# Patient Record
Sex: Male | Born: 1965
Health system: Southern US, Community
[De-identification: ages and names within clinical notes are randomized; demographics above are authoritative.]

## PROBLEM LIST (undated history)

## (undated) HISTORY — PX: SIGMOIDOSCOPY: SUR1295

---

## 2000-09-21 HISTORY — PX: VASECTOMY: SHX75

## 2013-07-04 ENCOUNTER — Encounter (INDEPENDENT_AMBULATORY_CARE_PROVIDER_SITE_OTHER): Payer: Self-pay

## 2013-07-04 ENCOUNTER — Encounter (INDEPENDENT_AMBULATORY_CARE_PROVIDER_SITE_OTHER): Payer: Self-pay | Admitting: Surgery

## 2013-07-04 ENCOUNTER — Ambulatory Visit (INDEPENDENT_AMBULATORY_CARE_PROVIDER_SITE_OTHER): Payer: Private Health Insurance - Indemnity | Admitting: Surgery

## 2013-07-04 VITALS — BP 130/76 | HR 70 | Temp 98.0°F | Resp 18 | Ht 67.0 in | Wt 155.0 lb

## 2013-07-04 DIAGNOSIS — K648 Other hemorrhoids: Secondary | ICD-10-CM

## 2013-07-04 DIAGNOSIS — K644 Residual hemorrhoidal skin tags: Secondary | ICD-10-CM

## 2013-07-04 NOTE — Progress Notes (Signed)
Subjective:     Patient ID: Samuel Wolfe, male   DOB: 18-Jun-1966, 47 y.o.   MRN: [id number]  HPI  Samuel Wolfe  1966/05/30 [id number]  Patient Care Team: Charna Archer as Attending Physician (Emergency Medicine)  This patient is a 47 y.o.male who presents today for surgical evaluation at the request of Samuel Wolfe.   Reason for visit: External hemorrhoids.  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  Usually has a bowel movement twice a day.  He has struggled with anal pain and presumed hemorrhoids for over two decades.  Can be painful to have bowel movements a time but not severe sharp pains.  Morbidly underlying.  Uses suppositories.  It often helps.  He has never had any prior interventions.  He has been hesitant to have anything done, but they are not going away.  He was discussed with his primary care physician in [place], [place] and then when he relocated here.  Surgical consultation requested.  Had a colonoscopy in 1998 that he tells me was normal.  This was for him bleeding/hemorrhoids.  I do not have a report of it with him.  There are no active problems to display for this patient.   No past medical history on file.  Past Surgical History  Procedure Laterality Date  . Vasectomy  2002    History   Social History  . Marital Status: Married    Spouse Name: N/A    Number of Children: N/A  . Years of Education: N/A   Occupational History  . Not on file.   Social History Main Topics  . Smoking status: Never Smoker   . Smokeless tobacco: Not on file  . Alcohol Use: Not on file  . Drug Use: Not on file  . Sexual Activity: Not on file   Other Topics Concern  . Not on file   Social History Narrative  . No narrative on file    Family History  Problem Relation Age of Onset  . Diabetes Father   . Heart disease Father   . Kidney disease Father     Current Outpatient Prescriptions  Medication Sig Dispense  Refill  . hydrocortisone-pramoxine (ANALPRAM-HC) 2.5-1 % rectal cream        No current facility-administered medications for this visit.     No Known Allergies  BP 130/76  Pulse 70  Temp(Src) 98 F (36.7 C)  Resp 18  Ht 5\' 7"  (1.702 m)  Wt 155 lb (70.308 kg)  BMI 24.27 kg/m2  No results found.   Review of Systems  Constitutional: Negative for fever, chills and diaphoresis.  HENT: Negative for sore throat and trouble swallowing.   Eyes: Negative for photophobia and visual disturbance.  Respiratory: Negative for choking and shortness of breath.   Cardiovascular: Negative for chest pain, palpitations and leg swelling.  Gastrointestinal: Positive for anal bleeding and rectal pain. Negative for nausea, vomiting, abdominal pain, diarrhea, constipation, blood in stool and abdominal distention.  Genitourinary: Negative for dysuria, urgency, difficulty urinating and testicular pain.  Musculoskeletal: Negative for arthralgias, gait problem, myalgias and neck pain.  Skin: Negative for color change and rash.  Neurological: Negative for dizziness, speech difficulty, weakness and numbness.  Hematological: Negative for adenopathy.  Psychiatric/Behavioral: Negative for hallucinations, confusion and agitation.       Objective:   Physical Exam  Constitutional: He is oriented to person, place, and time. He appears well-developed and well-nourished. No distress.  HENT:  Head: Normocephalic.  Mouth/Throat: Oropharynx is clear and moist. No oropharyngeal exudate.  Eyes: Conjunctivae and EOM are normal. Pupils are equal, round, and reactive to light. No scleral icterus.  Neck: Normal range of motion. Neck supple. No tracheal deviation present.  Cardiovascular: Normal rate, regular rhythm, normal heart sounds and intact distal pulses.   Pulmonary/Chest: Effort normal and breath sounds normal. No respiratory distress.  Abdominal: Soft. He exhibits no distension. There is no tenderness. Hernia  confirmed negative in the right inguinal area and confirmed negative in the left inguinal area.  Incisions clean with normal healing ridges.  No hernias  Genitourinary:  Exam done with assistance of male Medical Assistant in the room.  Perianal skin clean with good hygiene.  No pruritis.  No pilonidal disease.  No fissure.  No abscess/fistula.    R ant midline ext hemorrhoid bulging.  Tolerates digital and anoscopic rectal exam.  Increased sphincter tone.  No rectal masses.  Hemorrhoidal piles internally enlarged & friable x 3   Musculoskeletal: Normal range of motion. He exhibits no tenderness.  Lymphadenopathy:    He has no cervical adenopathy.       Right: No inguinal adenopathy present.       Left: No inguinal adenopathy present.  Neurological: He is alert and oriented to person, place, and time. No cranial nerve deficit. He exhibits normal muscle tone. Coordination normal.  Skin: Skin is warm and dry. No rash noted. He is not diaphoretic. No erythema. No pallor.  Psychiatric: He has a normal mood and affect. His behavior is normal. Judgment and thought content normal.       Assessment:     Symptomatic internal hemorrhoids with one external hemorrhoid.     Plan:     I long discussion with the patient.  I offered banding as a reasonable therapy to help them shrink down and hopefully become less symptomatic.  He agreed:  The anatomy & physiology of the anorectal region was discussed.  The pathophysiology of hemorrhoids and differential diagnosis was discussed.  Natural history progression  was discussed.   I stressed the importance of a fiber bowel regimen to have daily soft bowel movements to minimize progression of disease.     The patient's symptoms are not adequately controlled.  Therefore, I recommended banding to treat the hemorrhoids.  I went over the technique, risks, benefits, and alternatives.   Goals of post-operative recovery were discussed as well.  Questions were  answered.  The patient expressed understanding & wished to proceed.  The patient was positioned in the lateral decubitus position.  Perianal & rectal examination was done.  Using anoscopy, I ligated the hemorrhoids above the dentate line with banding.  The right anterior internal hemorrhoidal pile was somewhat thickened and hard to place a band fully on.  I was able to put a band on the other 2 piles.  The patient tolerated the procedure well.  Educational handouts further explaining the pathology, treatment options, and bowel regimen were given as well. He may require repeated banding, but I do not think it will progress to the point of requiring THD surgical ligation or hemorrhoidectomy at this time.  He and his wife feel reassured.

## 2013-07-04 NOTE — Patient Instructions (Addendum)
ANORECTAL SURGERY: POST OP INSTRUCTIONS  1. Take your usually prescribed home medications unless otherwise directed. 2. DIET: Follow a light bland diet the first 24 hours after arrival home, such as soup, liquids, crackers, etc.  Be sure to include lots of fluids daily.  Avoid fast food or heavy meals as your are more likely to get nauseated.  Eat a low fat the next few days after surgery.   3. PAIN CONTROL: a. Pain is best controlled by a usual combination of three different methods TOGETHER: i. Ice/Heat ii. Over the counter pain medication iii. Prescription pain medication b. Most patients will experience some swelling and discomfort in the anus/rectal area. and incisions.  Ice packs or heat (30-60 minutes up to 6 times a day) will help. Use ice for the first few days to help decrease swelling and bruising, then switch to heat such as warm towels, sitz baths, warm baths, etc to help relax tight/sore spots and speed recovery.  Some people prefer to use ice alone, heat alone, alternating between ice & heat.  Experiment to what works for you.  Swelling and bruising can take several weeks to resolve.   c. It is helpful to take an over-the-counter pain medication regularly for the first few weeks.  Choose one of the following that works best for you: i. Naproxen (Aleve, etc)  Two 220mg tabs twice a day ii. Ibuprofen (Advil, etc) Three 200mg tabs four times a day (every meal & bedtime) iii. Acetaminophen (Tylenol, etc) 500-650mg four times a day (every meal & bedtime) d. A  prescription for pain medication (such as oxycodone, hydrocodone, etc) should be given to you upon discharge.  Take your pain medication as prescribed.  i. If you are having problems/concerns with the prescription medicine (does not control pain, nausea, vomiting, rash, itching, etc), please call us [phone] to see if we need to switch you to a different pain medicine that will work better for you and/or control your side effect  better. ii. If you need a refill on your pain medication, please contact your pharmacy.  They will contact our office to request authorization. Prescriptions will not be filled after 5 pm or on week-ends.  Use a Sitz Bath 4-8 times a day for relief A sitz bath is a warm water bath taken in the sitting position that covers only the hips and buttocks. It may be used for either healing or hygiene purposes. Sitz baths are also used to relieve pain, itching, or muscle spasms. The water may contain medicine. Moist heat will help you heal and relax.  HOME CARE INSTRUCTIONS  Take 3 to 4 sitz baths a day. 1. Fill the bathtub half full with warm water. 2. Sit in the water and open the drain a little. 3. Turn on the warm water to keep the tub half full. Keep the water running constantly. 4. Soak in the water for 15 to 20 minutes. 5. After the sitz bath, pat the affected area dry first. SEEK MEDICAL CARE IF:  You get worse instead of better. Stop the sitz baths if you get worse.   4. KEEP YOUR BOWELS REGULAR a. The goal is one bowel movement a day b. Avoid getting constipated.  Between the surgery and the pain medications, it is common to experience some constipation.  Increasing fluid intake and taking a fiber supplement (such as Metamucil, Citrucel, FiberCon, MiraLax, etc) 1-2 times a day regularly will usually help prevent this problem from occurring.  A mild   laxative (prune juice, Milk of Magnesia, MiraLax, etc) should be taken according to package directions if there are no bowel movements after 48 hours. c. Watch out for diarrhea.  If you have many loose bowel movements, simplify your diet to bland foods & liquids for a few days.  Stop any stool softeners and decrease your fiber supplement.  Switching to mild anti-diarrheal medications (Kayopectate, Pepto Bismol) can help.  If this worsens or does not improve, please call us.  5. Wound Care a. Remove your bandages the day after surgery.  Unless  discharge instructions indicate otherwise, leave your bandage dry and in place overnight.  Remove the bandage during your first bowel movement.   b. Allow the wound packing to fall out over the next few days.  You can trim exposed gauze / ribbon as it falls out.  You do not need to repack the wound unless instructed otherwise.  Wear an absorbent pad or soft cotton gauze in your underwear as needed to catch any drainage and help keep the area  c. Keep the area clean and dry.  Bathe / shower every day.  Keep the area clean by showering / bathing over the incision / wound.   It is okay to soak an open wound to help wash it.  Wet wipes or showers / gentle washing after bowel movements is often less traumatic than regular toilet paper. d. You may have some styrofoam-like soft packing in the rectum which will come out with the first bowel movement.  e. You will often notice bleeding with bowel movements.  This should slow down by the end of the first week of surgery f. Expect some drainage.  This should slow down, too, by the end of the first week of surgery.  Wear an absorbent pad or soft cotton gauze in your underwear until the drainage stops. 6. ACTIVITIES as tolerated:   a. You may resume regular (light) daily activities beginning the next day-such as daily self-care, walking, climbing stairs-gradually increasing activities as tolerated.  If you can walk 30 minutes without difficulty, it is safe to try more intense activity such as jogging, treadmill, bicycling, low-impact aerobics, swimming, etc. b. Save the most intensive and strenuous activity for last such as sit-ups, heavy lifting, contact sports, etc  Refrain from any heavy lifting or straining until you are off narcotics for pain control.   c. DO NOT PUSH THROUGH PAIN.  Let pain be your guide: If it hurts to do something, don't do it.  Pain is your body warning you to avoid that activity for another week until the pain goes down. d. You may drive when  you are no longer taking prescription pain medication, you can comfortably sit for long periods of time, and you can safely maneuver your car and apply brakes. e. You may have sexual intercourse when it is comfortable.  7. FOLLOW UP in our office a. Please call [facility] at [phone] to set up an appointment to see your surgeon in the office for a follow-up appointment approximately 2 weeks after your surgery. b. Make sure that you call for this appointment the day you arrive home to insure a convenient appointment time. 10. IF YOU HAVE DISABILITY OR FAMILY LEAVE FORMS, BRING THEM TO THE OFFICE FOR PROCESSING.  DO NOT GIVE THEM TO YOUR DOCTOR.        WHEN TO CALL US [phone]: 1. Poor pain control 2. Reactions / problems with new medications (rash/itching, nausea, etc)    3. Fever over 101.5 F (38.5 C) 4. Inability to urinate 5. Nausea and/or vomiting 6. Worsening swelling or bruising 7. Continued bleeding from incision. 8. Increased pain, redness, or drainage from the incision  The clinic staff is available to answer your questions during regular business hours (8:30am-5pm).  Please don't hesitate to call and ask to speak to one of our nurses for clinical concerns.   A surgeon from [facility] is always on call at the hospitals   If you have a medical emergency, go to the nearest emergency room or call 911.    [facility] [address], [address], [place], [place]  [address] ? MAIN: [phone] ? TOLL FREE: [phone] ? FAX [phone] [email or web address]  HEMORRHOIDS  The rectum is the last foot of your colon, and it naturally stretches to hold stool.  Hemorrhoidal piles are natural clusters of blood vessels that help the rectum and anal canal stretch to hold stool and allow bowel movements to eliminate feces.   Hemorrhoids are abnormally swollen blood vessels in the rectum.  Too much pressure in the rectum causes  hemorrhoids by forcing blood to stretch and bulge the walls of the veins, sometimes even rupturing them.  Hemorrhoids can become like varicose veins you might see on a person's legs.  Most people will develop a flare of hemorrhoids in their lifetime.  When bulging hemorrhoidal veins are irritated, they can swell, burn, itch, cause pain, and bleed.  Most flares will calm down gradually own within a few weeks.  However, once hemorrhoids are created, they are difficult to get rid of completely and tend to flare more easily than the first flare.   Fortunately, good habits and simple medical treatment usually control hemorrhoids well, and surgery is needed only in severe cases. Types of Hemorrhoids:  Internal hemorrhoids usually don't initially hurt or itch; they are deep inside the rectum and usually have no sensation. If they begin to push out (prolapse), pain and burning can occur.  However, internal hemorrhoids can bleed.  Anal bleeding should not be ignored since bleeding could come from a dangerous source like colorectal cancer, so persistent rectal bleeding should be investigated by a doctor, sometimes with a colonoscopy.  External hemorrhoids cause most of the symptoms - pain, burning, and itching. Nonirritated hemorrhoids can look like small skin tags coming out of the anus.   Thrombosed hemorrhoids can form when a hemorrhoid blood vessel bursts and causes the hemorrhoid to suddenly swell.  A purple blood clot can form in it and become an excruciatingly painful lump at the anus. Because of these unpleasant symptoms, immediate incision and drainage by a surgeon at an office visit can provide much relief of the pain.    PREVENTION Avoiding the most frequent causes listed below will prevent most cases of hemorrhoids: Constipation Hard stools Diarrhea  Constant sitting  Straining with bowel movements Sitting on the toilet for a long time  Severe coughing  episodes Pregnancy / Childbirth  Heavy  Lifting  Sometimes avoiding the above triggers is difficult:  How can you avoid sitting all day if you have a seated job? Also, we try to avoid coughing and diarrhea, but sometimes it's beyond your control.  Still, there are some practical hints to help: Keep the anal and genital area clean.  Moistened tissues such as flushable wet wipes are less irritating than toilet paper.  Using irrigating showers or bottle irrigation washing gently cleans this sensitive area.     Avoid dry toilet paper when cleaning after bowel movements.  . Keep the anal and genital area dry.  Lightly pat the rectal area dry.  Avoid rubbing.  Talcum or baby powders can help GET YOUR STOOLS SOFT.   This is the most important way to prevent irritated hemorrhoids.  Hard stools are like sandpaper to the anorectal canal and will cause more problems.  The goal: ONE SOFT BOWEL MOVEMENT A DAY!  BMs from every other day to 3 times a day is a tolerable range Treat coughing, diarrhea and constipation early since irritated hemorrhoids may soon follow.  If your main job activity is seated, always stand or walk during your breaks. Make it a point to stand and walk at least 5 minutes every hour and try to shift frequently in your chair to avoid direct rectal pressure.  Always exhale as you strain or lift. Don't hold your breath.  Do not delay or try to prevent a bowel movement when the urge is present. Exercise regularly (walking or jogging 60 minutes a day) to stimulate the bowels to move. No reading or other activity while on the toilet. If bowel movements take longer than 5 minutes, you are too constipated. AVOID CONSTIPATION Drink plenty of liquids (1 1/2 to 2 quarts of water and other fluids a day unless fluid restricted for another medical condition). Liquids that contain caffeine (coffee a, tea, soft drinks) can be dehydrating and should be avoided until constipation is controlled. Consider minimizing milk, as dairy products may be  constipating. Eat plenty of fiber (30g a day ideal, more if needed).  Fiber is the undigested part of plant food that passes into the colon, acting as "natures broom" to encourage bowel motility and movement.  Fiber can absorb and hold large amounts of water. This results in a larger, bulkier stool, which is soft and easier to pass.  Eating foods high in fiber - 12 servings - such as  Vegetables: Root (potatoes, carrots, turnips), Leafy green (lettuce, salad greens, celery, spinach), High residue (cabbage, broccoli, etc.) Fruit: Fresh, Dried (prunes, apricots, cherries), Stewed (applesauce)  Whole grain breads, pasta, whole wheat Bran cereals, muffins, etc. Consider adding supplemental bulking fiber which retains large volumes of water: Psyllium ground seeds (native plant from [place])--available as Metamucil, Konsyl, Effersyllium, Per Diem Fiber, or the less expensive generic forms.  Citrucel  (methylcellulose wood fiber) . FiberCon (Polycarbophil) Polyethylene Glycol - and "artificial" fiber commonly called Miralax or Glycolax.  It is helpful for people with gassy or bloated feelings with regular fiber Flax Seed - a less gassy natural fiber  Laxatives can be useful for a short period if constipation is severe Osmotics (Milk of Magnesia, Fleets Phospho-Soda, Magnesium Citrate)  Stimulants (Senokot,   Castor Oil,  Dulcolax, Ex-Lax)    Laxatives are not a good long-term solution as it can stress the bowels and cause too much mineral loss and dehydration.   Avoid taking laxatives for more than 7 days in a row.  AVOID DIARRHEA Switch to liquids and simpler foods for a few days to avoid stressing your intestines further. Avoid dairy products (especially milk & ice cream) for a short time.  The intestines often can lose the ability to digest lactose when stressed. Avoid foods that cause gassiness or bloating.  Typical foods include beans and other legumes, cabbage, broccoli, and dairy foods.   Every person has some sensitivity to other foods, so listen to your body and avoid those foods that trigger problems for   you. Adding fiber (Citrucel, Metamucil, FiberCon, Flax seed, Miralax) gradually can help thicken stools by absorbing excess fluid and retrain the intestines to act more normally.  Slowly increase the dose over a few weeks.  Too much fiber too soon can backfire and cause cramping & bloating. Probiotics (such as active yogurt, Align, etc) may help repopulate the intestines and colon with normal bacteria and calm down a sensitive digestive tract.  Most studies show it to be of mild help, though, and such products can be costly. Medicines: Bismuth subsalicylate (ex. Kayopectate, Pepto Bismol) every 30 minutes for up to 6 doses can help control diarrhea.  Avoid if pregnant. Loperamide (Immodium) can slow down diarrhea.  Start with two tablets (4mg total) first and then try one tablet every 6 hours.  Avoid if you are having fevers or severe pain.  If you are not better or start feeling worse, stop all medicines and call your doctor for advice Call your doctor if you are getting worse or not better.  Sometimes further testing (cultures, endoscopy, X-ray studies, bloodwork, etc) may be needed to help diagnose and treat the cause of the diarrhea. TREATMENT OF HEMORRHOID FLARE If these preventive measures fail, you must take action right away! Hemorrhoids are one condition that can be mild in the morning and become intolerable by nightfall. Most hemorrhoidal flares take several weeks to calm down.  These suggestions can help: Warm soaks.  This helps more than any topical medication.  Use up to 8 times a day.  Usually sitz baths or sitting in a warm bathtub helps.  Sitting on moist warm towels are helpful.  Switching to ice packs/cool compresses can be helpful  Use a Sitz Bath 4-8 times a day for relief A sitz bath is a warm water bath taken in the sitting position that covers only the hips and  buttocks. It may be used for either healing or hygiene purposes. Sitz baths are also used to relieve pain, itching, or muscle spasms. The water may contain medicine. Moist heat will help you heal and relax.  HOME CARE INSTRUCTIONS  Take 3 to 4 sitz baths a day. 6. Fill the bathtub half full with warm water. 7. Sit in the water and open the drain a little. 8. Turn on the warm water to keep the tub half full. Keep the water running constantly. 9. Soak in the water for 15 to 20 minutes. 10. After the sitz bath, pat the affected area dry first. SEEK MEDICAL CARE IF:  You get worse instead of better. Stop the sitz baths if you get worse.  Normalize your bowels.  Extremes of diarrhea or constipation will make hemorrhoids worse.  One soft bowel movement a day is the goal.  Fiber can help get your bowels regular Wet wipes instead of toilet paper Pain control with a NSAID such as ibuprofen (Advil) or naproxen (Aleve) or acetaminophen (Tylenol) around the clock.  Narcotics are constipating and should be minimized if possible Topical creams contain steroids (bydrocortisone) or local anesthetic (xylocaine) can help make pain and itching more tolerable.   EVALUATION If hemorrhoids are still causing problems, you could benefit by an evaluation by a surgeon.  The surgeon will obtain a history and examine you.  If hemorrhoids are diagnosed, some therapies can be offered in the office, usually with an anoscope into the less sensitive area of the rectum: -injection of hemorrhoids (sclerotherapy) can scar the blood vessels of the swollen/enlarged hemorrhoids to help shrink them down to   a more normal size -rubber banding of the enlarged hemorrhoids to help shrink them down to a more normal size -drainage of the blood clot causing a thrombosed hemorrhoid,  to relieve the severe pain   While 90% of the time such problems from hemorrhoids can be managed without preceding to surgery, sometimes the hemorrhoids require a  operation to control the problem (uncontrolled bleeding, prolapse, pain, etc.).   This involves being placed under general anesthesia where the surgeon can confirm the diagnosis and remove, suture, or staple the hemorrhoid(s).  Your surgeon can help you treat the problem appropriately.    GETTING TO GOOD BOWEL HEALTH. Irregular bowel habits such as constipation and diarrhea can lead to many problems over time.  Having one soft bowel movement a day is the most important way to prevent further problems.  The anorectal canal is designed to handle stretching and feces to safely manage our ability to get rid of solid waste (feces, poop, stool) out of our body.  BUT, hard constipated stools can act like ripping concrete bricks and diarrhea can be a burning fire to this very sensitive area of our body, causing inflamed hemorrhoids, anal fissures, increasing risk is perirectal abscesses, abdominal pain/bloating, an making irritable bowel worse.     The goal: ONE SOFT BOWEL MOVEMENT A DAY!  To have soft, regular bowel movements:    Drink at least 8 tall glasses of water a day.     Take plenty of fiber.  Fiber is the undigested part of plant food that passes into the colon, acting s "natures broom" to encourage bowel motility and movement.  Fiber can absorb and hold large amounts of water. This results in a larger, bulkier stool, which is soft and easier to pass. Work gradually over several weeks up to 6 servings a day of fiber (25g a day even more if needed) in the form of: o Vegetables -- Root (potatoes, carrots, turnips), leafy green (lettuce, salad greens, celery, spinach), or cooked high residue (cabbage, broccoli, etc) o Fruit -- Fresh (unpeeled skin & pulp), Dried (prunes, apricots, cherries, etc ),  or stewed ( applesauce)  o Whole grain breads, pasta, etc (whole wheat)  o Bran cereals    Bulking Agents -- This type of water-retaining fiber generally is easily obtained each day by one of the following:   o Psyllium bran -- The psyllium plant is remarkable because its ground seeds can retain so much water. This product is available as Metamucil, Konsyl, Effersyllium, Per Diem Fiber, or the less expensive generic preparation in drug and health food stores. Although labeled a laxative, it really is not a laxative.  o Methylcellulose -- This is another fiber derived from wood which also retains water. It is available as Citrucel. o Polyethylene Glycol - and "artificial" fiber commonly called Miralax or Glycolax.  It is helpful for people with gassy or bloated feelings with regular fiber o Flax Seed - a less gassy fiber than psyllium   No reading or other relaxing activity while on the toilet. If bowel movements take longer than 5 minutes, you are too constipated   AVOID CONSTIPATION.  High fiber and water intake usually takes care of this.  Sometimes a laxative is needed to stimulate more frequent bowel movements, but    Laxatives are not a good long-term solution as it can wear the colon out. o Osmotics (Milk of Magnesia, Fleets phosphosoda, Magnesium citrate, MiraLax, GoLytely) are safer than  o Stimulants (Senokot, Castor Oil, Dulcolax,   Ex Lax)    o Do not take laxatives for more than 7days in a row.    IF SEVERELY CONSTIPATED, try a Bowel Retraining Program: o Do not use laxatives.  o Eat a diet high in roughage, such as bran cereals and leafy vegetables.  o Drink six (6) ounces of prune or apricot juice each morning.  o Eat two (2) large servings of stewed fruit each day.  o Take one (1) heaping tablespoon of a psyllium-based bulking agent twice a day. Use sugar-free sweetener when possible to avoid excessive calories.  o Eat a normal breakfast.  o Set aside 15 minutes after breakfast to sit on the toilet, but do not strain to have a bowel movement.  o If you do not have a bowel movement by the third day, use an enema and repeat the above steps.    Controlling diarrhea o Switch to liquids and  simpler foods for a few days to avoid stressing your intestines further. o Avoid dairy products (especially milk & ice cream) for a short time.  The intestines often can lose the ability to digest lactose when stressed. o Avoid foods that cause gassiness or bloating.  Typical foods include beans and other legumes, cabbage, broccoli, and dairy foods.  Every person has some sensitivity to other foods, so listen to our body and avoid those foods that trigger problems for you. o Adding fiber (Citrucel, Metamucil, psyllium, Miralax) gradually can help thicken stools by absorbing excess fluid and retrain the intestines to act more normally.  Slowly increase the dose over a few weeks.  Too much fiber too soon can backfire and cause cramping & bloating. o Probiotics (such as active yogurt, Align, etc) may help repopulate the intestines and colon with normal bacteria and calm down a sensitive digestive tract.  Most studies show it to be of mild help, though, and such products can be costly. o Medicines:   Bismuth subsalicylate (ex. Kayopectate, Pepto Bismol) every 30 minutes for up to 6 doses can help control diarrhea.  Avoid if pregnant.   Loperamide (Immodium) can slow down diarrhea.  Start with two tablets (4mg total) first and then try one tablet every 6 hours.  Avoid if you are having fevers or severe pain.  If you are not better or start feeling worse, stop all medicines and call your doctor for advice o Call your doctor if you are getting worse or not better.  Sometimes further testing (cultures, endoscopy, X-ray studies, bloodwork, etc) may be needed to help diagnose and treat the cause of the diarrhea. o  

## 2018-07-11 ENCOUNTER — Ambulatory Visit: Payer: Commercial Managed Care - PPO | Admitting: Family Medicine

## 2018-07-11 ENCOUNTER — Other Ambulatory Visit: Payer: Self-pay

## 2018-07-11 ENCOUNTER — Encounter: Payer: Self-pay | Admitting: Family Medicine

## 2018-07-11 VITALS — BP 139/82 | HR 64 | Temp 98.6°F | Ht 68.0 in | Wt 159.6 lb

## 2018-07-11 DIAGNOSIS — M25551 Pain in right hip: Secondary | ICD-10-CM

## 2018-07-11 DIAGNOSIS — Z23 Encounter for immunization: Secondary | ICD-10-CM | POA: Diagnosis not present

## 2018-07-11 NOTE — Patient Instructions (Signed)
° ° ° °  If you have lab work done today you will be contacted with your lab results within the next 2 weeks.  If you have not heard from us then please contact us. The fastest way to get your results is to register for My Chart. ° ° °IF you received an x-ray today, you will receive an invoice from [facility]. Please contact [facility] at [phone] with questions or concerns regarding your invoice.  ° °IF you received labwork today, you will receive an invoice from [facility]. Please contact [facility] at [phone] with questions or concerns regarding your invoice.  ° °Our billing staff will not be able to assist you with questions regarding bills from these companies. ° °You will be contacted with the lab results as soon as they are available. The fastest way to get your results is to activate your My Chart account. Instructions are located on the last page of this paperwork. If you have not heard from us regarding the results in 2 weeks, please contact this office. °  ° ° ° °

## 2018-07-11 NOTE — Progress Notes (Signed)
   10/21/20194:22 PM  Chevelle Durr [birth year], 52 y.o. male [id number]  Chief Complaint  Patient presents with  . Pain    sharp pain in the right hip, had imaging done, no abnormal findings. Pain is now dull. Taking advil for pain. Started while running on uneven surface    HPI:   Patient is a 52 y.o. male who presents today for right hip pain  About a week ago started having right hip/low back pain after he went to pickup a folding ladder Then started noticing pain along ASIS Went to [facility] - xray was normal He did some gentle yoga stretches and ibu Pain now overall much better  When he sits for long periods of time He is wondering if a standing desk would help  He runs 4-6 miles 3 times a week  Also requesting Td booster, last one over 10 years ago Will be working a new old home he recently bought   Fall Risk  07/11/2018 07/11/2018  Falls in the past year? No No     Depression screen [facility] 2/9 07/11/2018 07/11/2018  Decreased Interest 0 0  Down, Depressed, Hopeless 0 0  PHQ - 2 Score 0 0    No Known Allergies  Prior to Admission medications   Medication Sig Start Date End Date Taking? Authorizing Provider  hydrocortisone-pramoxine [facility]) 2.5-1 % rectal cream  06/30/13   [provider]    History reviewed. No pertinent past medical history.  Past Surgical History:  Procedure Laterality Date  . VASECTOMY  2002    Social History   Tobacco Use  . Smoking status: Never Smoker  . Smokeless tobacco: Never Used  Substance Use Topics  . Alcohol use: Never    Frequency: Never    Family History  Problem Relation Age of Onset  . Diabetes Father   . Heart disease Father   . Kidney disease Father     ROS Per hpi  OBJECTIVE:  Blood pressure 139/82, pulse 64, temperature 98.6 F (37 C), temperature source Oral, height 5\' 8"  (1.727 m), weight 159 lb 9.6 oz (72.4 kg), SpO2 98 %. Body mass index is 24.27 kg/m.   Physical Exam   Constitutional: He is oriented to person, place, and time. He appears well-developed and well-nourished.  HENT:  Head: Normocephalic and atraumatic.  Mouth/Throat: Oropharynx is clear and moist.  Eyes: Pupils are equal, round, and reactive to light. Conjunctivae and EOM are normal.  Neck: Neck supple.  Pulmonary/Chest: Effort normal.  Musculoskeletal:       Right hip: Normal.       Left hip: Normal.  Neurological: He is alert and oriented to person, place, and time.  Skin: Skin is warm and dry.  Psychiatric: He has a normal mood and affect.  Nursing note and vitals reviewed.    ASSESSMENT and PLAN  1. Right hip pain Normal exam today. Continue with stretching and strengthening. Provided patient handouts. Resume running slowly, advance as tolerated. Letter for standing/sitting workstation provided to help with hip flexors.   Other orders - Td vaccine greater than or equal to 7yo preservative free IM  Return if symptoms worsen or fail to improve.    Rutherford Guys, MD Primary Care at [place], [place] [address] Ph.  [phone] Fax [phone]

## 2018-10-25 DIAGNOSIS — M65312 Trigger thumb, left thumb: Secondary | ICD-10-CM | POA: Diagnosis not present

## 2018-11-07 DIAGNOSIS — M65311 Trigger thumb, right thumb: Secondary | ICD-10-CM | POA: Diagnosis not present

## 2018-12-02 DIAGNOSIS — R3912 Poor urinary stream: Secondary | ICD-10-CM | POA: Diagnosis not present

## 2019-06-13 ENCOUNTER — Telehealth: Payer: Self-pay | Admitting: Family Medicine

## 2019-06-13 NOTE — Telephone Encounter (Signed)
Copied from [place] [id number]. Topic: Quick Communication - Appointment Cancellation >> Jun 13, 2019 12:16 PM Celene Kras A wrote: Patient called to cancel appointment scheduled for 06/15/2019. Patient has not rescheduled their appointment. Pt is requesting a call back to reschedule. Please advise.   Route to department's PEC pool. >> Jun 13, 2019  3:57 PM Leta Baptist wrote: Canceled appt and lvmtcb and reschedule appt

## 2019-06-15 ENCOUNTER — Ambulatory Visit: Payer: Commercial Managed Care - PPO | Admitting: Family Medicine

## 2019-07-04 ENCOUNTER — Ambulatory Visit: Payer: Commercial Managed Care - PPO | Admitting: Family Medicine

## 2019-07-04 ENCOUNTER — Encounter: Payer: Self-pay | Admitting: Family Medicine

## 2019-07-04 ENCOUNTER — Other Ambulatory Visit: Payer: Self-pay

## 2019-07-04 VITALS — BP 139/85 | HR 70 | Temp 98.7°F | Ht 68.0 in | Wt 156.4 lb

## 2019-07-04 DIAGNOSIS — Z23 Encounter for immunization: Secondary | ICD-10-CM | POA: Diagnosis not present

## 2019-07-04 DIAGNOSIS — N401 Enlarged prostate with lower urinary tract symptoms: Secondary | ICD-10-CM

## 2019-07-04 DIAGNOSIS — R030 Elevated blood-pressure reading, without diagnosis of hypertension: Secondary | ICD-10-CM

## 2019-07-04 DIAGNOSIS — R972 Elevated prostate specific antigen [PSA]: Secondary | ICD-10-CM

## 2019-07-04 DIAGNOSIS — R3912 Poor urinary stream: Secondary | ICD-10-CM

## 2019-07-04 DIAGNOSIS — Z1211 Encounter for screening for malignant neoplasm of colon: Secondary | ICD-10-CM

## 2019-07-04 DIAGNOSIS — Z125 Encounter for screening for malignant neoplasm of prostate: Secondary | ICD-10-CM

## 2019-07-04 LAB — POCT URINALYSIS DIP (MANUAL ENTRY)
Bilirubin, UA: NEGATIVE
Blood, UA: NEGATIVE
Glucose, UA: NEGATIVE mg/dL
Ketones, POC UA: NEGATIVE mg/dL
Leukocytes, UA: NEGATIVE
Nitrite, UA: NEGATIVE
Protein Ur, POC: NEGATIVE mg/dL
Spec Grav, UA: 1.015 (ref 1.010–1.025)
Urobilinogen, UA: 0.2 E.U./dL
pH, UA: 7 (ref 5.0–8.0)

## 2019-07-04 MED ORDER — TAMSULOSIN HCL 0.4 MG PO CAPS: 0.4000 mg | ORAL_CAPSULE | Freq: Every day | ORAL | 3 refills | Status: DC

## 2019-07-04 NOTE — Patient Instructions (Signed)
° ° ° °  If you have lab work done today you will be contacted with your lab results within the next 2 weeks.  If you have not heard from us then please contact us. The fastest way to get your results is to register for My Chart. ° ° °IF you received an x-ray today, you will receive an invoice from [facility]. Please contact [facility] at [phone] with questions or concerns regarding your invoice.  ° °IF you received labwork today, you will receive an invoice from [facility]. Please contact [facility] at [phone] with questions or concerns regarding your invoice.  ° °Our billing staff will not be able to assist you with questions regarding bills from these companies. ° °You will be contacted with the lab results as soon as they are available. The fastest way to get your results is to activate your My Chart account. Instructions are located on the last page of this paperwork. If you have not heard from us regarding the results in 2 weeks, please contact this office. °  ° ° ° °

## 2019-07-04 NOTE — Progress Notes (Signed)
10/13/202010:44 AM  Samuel Wolfe Aug 10, 1966, 53 y.o., male [id number]  Chief Complaint  Patient presents with  . Urinary Retention    finding that stream in becoming weaker, noticed in April. has had hx of hemmorrhoids has not been a px in a few yrs. Takes laxatives    HPI:   Patient is a 53 y.o. male  who presents today for weak stream  Gradually worsening of urination Having weak stream, taking longer to urinate, able to empty bladder No nocturia No hematuria No dysuria Mild intermittent urgency No fever or chills, weight loss or swollen glands Maybe has a couple of uncles with prostate cancer  Has never had colon cancer screening No fhx colon cancer Has known hemorrhoids  Runs/bikes/walks every day for about 45 minutes Loves cheeze its   Depression screen [facility] 2/9 07/04/2019 07/11/2018 07/11/2018  Decreased Interest 0 0 0  Down, Depressed, Hopeless 0 0 0  PHQ - 2 Score 0 0 0    Fall Risk  07/04/2019 07/11/2018 07/11/2018  Falls in the past year? 0 No No  Number falls in past yr: 0 - -  Injury with Fall? 0 - -     No Known Allergies  Prior to Admission medications   Not on File    History reviewed. No pertinent past medical history.  Past Surgical History:  Procedure Laterality Date  . VASECTOMY  2002    Social History   Tobacco Use  . Smoking status: Never Smoker  . Smokeless tobacco: Never Used  Substance Use Topics  . Alcohol use: Yes    Frequency: Never    Family History  Problem Relation Age of Onset  . Diabetes Father   . Heart disease Father   . Kidney disease Father     Review of Systems  Constitutional: Negative for chills and fever.  Respiratory: Negative for cough and shortness of breath.   Cardiovascular: Negative for chest pain, palpitations and leg swelling.  Gastrointestinal: Negative for abdominal pain, nausea and vomiting.  per hpi   OBJECTIVE:  Today's Vitals   07/04/19 1028  BP: 139/85  Pulse: 70  Temp:  98.7 F (37.1 C)  SpO2: 100%  Weight: 156 lb 6.4 oz (70.9 kg)  Height: 5\' 8"  (1.727 m)   Body mass index is 23.78 kg/m.   Physical Exam Vitals signs and nursing note reviewed.  Constitutional:      Appearance: He is well-developed.  HENT:     Head: Normocephalic and atraumatic.  Eyes:     Conjunctiva/sclera: Conjunctivae normal.     Pupils: Pupils are equal, round, and reactive to light.  Neck:     Musculoskeletal: Neck supple.  Cardiovascular:     Rate and Rhythm: Normal rate and regular rhythm.     Heart sounds: No murmur. No friction rub. No gallop.   Pulmonary:     Effort: Pulmonary effort is normal.     Breath sounds: Normal breath sounds. No wheezing or rales.  Musculoskeletal:     Right lower leg: No edema.     Left lower leg: No edema.  Skin:    General: Skin is warm and dry.  Neurological:     Mental Status: He is alert and oriented to person, place, and time.     Results for orders placed or performed in visit on 07/04/19 (from the past 24 hour(s))  POCT urinalysis dipstick     Status: None   Collection Time: 07/04/19 10:24 AM  Result Value Ref  Range   Color, UA yellow yellow   Clarity, UA clear clear   Glucose, UA negative negative mg/dL   Bilirubin, UA negative negative   Ketones, POC UA negative negative mg/dL   Spec Grav, UA 1.015 1.010 - 1.025   Blood, UA negative negative   pH, UA 7.0 5.0 - 8.0   Protein Ur, POC negative negative mg/dL   Urobilinogen, UA 0.2 0.2 or 1.0 E.U./dL   Nitrite, UA Negative Negative   Leukocytes, UA Negative Negative    No results found.   ASSESSMENT and PLAN  1. Benign prostatic hyperplasia with weak urinary stream Starting flomax, reviewed r/se/b, psa pending. Consider urology referral  2. Weak urinary stream - POCT urinalysis dipstick - CBC - Comprehensive metabolic panel  3. Elevated BP without diagnosis of hypertension Discussed LFM, recheck at next OV  4. Need for prophylactic vaccination and  inoculation against influenza - Flu Vaccine QUAD 36+ mos IM  5. Special screening for malignant neoplasms, colon - Ambulatory referral to Gastroenterology  6. Screening for prostate cancer - PSA  Other orders - tamsulosin (FLOMAX) 0.4 MG CAPS capsule; Take 1 capsule (0.4 mg total) by mouth daily.  Return in about 2 months (around 09/03/2019).    Rutherford Guys, MD Primary Care at [place] [address],  [address] Ph.  [phone] Fax [phone]

## 2019-07-05 LAB — CBC
Hematocrit: 47.2 % (ref 37.5–51.0)
Hemoglobin: 15.7 g/dL (ref 13.0–17.7)
MCH: 30.9 pg (ref 26.6–33.0)
MCHC: 33.3 g/dL (ref 31.5–35.7)
MCV: 93 fL (ref 79–97)
Platelets: 241 10*3/uL (ref 150–450)
RBC: 5.08 x10E6/uL (ref 4.14–5.80)
RDW: 12.8 % (ref 11.6–15.4)
WBC: 4.1 10*3/uL (ref 3.4–10.8)

## 2019-07-05 LAB — COMPREHENSIVE METABOLIC PANEL
ALT: 15 IU/L (ref 0–44)
AST: 25 IU/L (ref 0–40)
Albumin/Globulin Ratio: 1.7 (ref 1.2–2.2)
Albumin: 4.7 g/dL (ref 3.8–4.9)
Alkaline Phosphatase: 75 IU/L (ref 39–117)
BUN/Creatinine Ratio: 9 (ref 9–20)
BUN: 8 mg/dL (ref 6–24)
Bilirubin Total: 0.7 mg/dL (ref 0.0–1.2)
CO2: 22 mmol/L (ref 20–29)
Calcium: 9.5 mg/dL (ref 8.7–10.2)
Chloride: 103 mmol/L (ref 96–106)
Creatinine, Ser: 0.91 mg/dL (ref 0.76–1.27)
GFR calc Af Amer: 111 mL/min/{1.73_m2} (ref >59)
GFR calc non Af Amer: 96 mL/min/{1.73_m2} (ref >59)
Globulin, Total: 2.7 g/dL (ref 1.5–4.5)
Glucose: 95 mg/dL (ref 65–99)
Potassium: 4.5 mmol/L (ref 3.5–5.2)
Sodium: 141 mmol/L (ref 134–144)
Total Protein: 7.4 g/dL (ref 6.0–8.5)

## 2019-07-05 LAB — PSA: Prostate Specific Ag, Serum: 6.5 ng/mL — ABNORMAL HIGH (ref 0.0–4.0)

## 2019-07-11 ENCOUNTER — Telehealth: Payer: Self-pay

## 2019-07-11 NOTE — Telephone Encounter (Signed)
Pt would like to know what does mildly elevated psa mean? His psa is 6.5

## 2019-07-11 NOTE — Addendum Note (Signed)
Addended by: Rutherford Guys on: 07/11/2019 11:06 AM   Modules accepted: Orders

## 2019-07-11 NOTE — Telephone Encounter (Signed)
Pt understood, will wait appt for uro

## 2019-07-11 NOTE — Telephone Encounter (Signed)
PSA elevations are not always concerning, but that is why urologist needs to evaluate and decide next steps. thanks

## 2019-07-13 ENCOUNTER — Encounter: Payer: Self-pay | Admitting: Gastroenterology

## 2019-07-28 ENCOUNTER — Other Ambulatory Visit: Payer: Self-pay

## 2019-07-28 ENCOUNTER — Ambulatory Visit (AMBULATORY_SURGERY_CENTER): Payer: Commercial Managed Care - PPO | Admitting: *Deleted

## 2019-07-28 VITALS — Temp 97.8°F | Ht 68.0 in | Wt 157.0 lb

## 2019-07-28 DIAGNOSIS — Z1211 Encounter for screening for malignant neoplasm of colon: Secondary | ICD-10-CM

## 2019-07-28 DIAGNOSIS — Z1159 Encounter for screening for other viral diseases: Secondary | ICD-10-CM

## 2019-07-28 MED ORDER — NA SULFATE-K SULFATE-MG SULF 17.5-3.13-1.6 GM/177ML PO SOLN: ORAL | 0 refills | Status: DC

## 2019-07-28 NOTE — Progress Notes (Signed)
Patient is here in-person for PV. Patient denies any allergies to eggs or soy. Patient denies any problems with anesthesia/sedation. Patient denies any oxygen use at home. Patient denies taking any diet/weight loss medications or blood thinners. Patient is not being treated for MRSA or C-diff. EMMI education assisgned to the patient for the procedure, this was explained and instructions given to patient. COVID-19 screening test is on 11/17 at 310pm, the pt is aware. Pt is aware that care partner will wait in the car during procedure; if they feel like they will be too hot or cold to wait in the car; they may wait in the 4 th floor lobby. Patient is aware to bring only one care partner. We want them to wear a mask (we do not have any that we can provide them), practice social distancing, and we will check their temperatures when they get here.  I did remind the patient that their care partner needs to stay in the parking lot the entire time and have a cell phone available, we will call them when the pt is ready for discharge. Patient will wear mask into building.    Suprep $15 off coupon given to the patient.

## 2019-07-31 ENCOUNTER — Encounter: Payer: Self-pay | Admitting: Gastroenterology

## 2019-08-08 ENCOUNTER — Ambulatory Visit (INDEPENDENT_AMBULATORY_CARE_PROVIDER_SITE_OTHER): Payer: Commercial Managed Care - PPO

## 2019-08-08 ENCOUNTER — Other Ambulatory Visit: Payer: Self-pay | Admitting: Gastroenterology

## 2019-08-08 DIAGNOSIS — Z1159 Encounter for screening for other viral diseases: Secondary | ICD-10-CM

## 2019-08-09 LAB — SARS CORONAVIRUS 2 (TAT 6-24 HRS): SARS Coronavirus 2: NEGATIVE

## 2019-08-11 ENCOUNTER — Ambulatory Visit (AMBULATORY_SURGERY_CENTER): Payer: Commercial Managed Care - PPO | Admitting: Gastroenterology

## 2019-08-11 ENCOUNTER — Encounter: Payer: Self-pay | Admitting: Gastroenterology

## 2019-08-11 ENCOUNTER — Other Ambulatory Visit: Payer: Self-pay

## 2019-08-11 VITALS — BP 112/62 | HR 54 | Temp 98.3°F | Resp 18 | Ht 68.0 in | Wt 157.0 lb

## 2019-08-11 DIAGNOSIS — K635 Polyp of colon: Secondary | ICD-10-CM

## 2019-08-11 DIAGNOSIS — K640 First degree hemorrhoids: Secondary | ICD-10-CM

## 2019-08-11 DIAGNOSIS — Z1211 Encounter for screening for malignant neoplasm of colon: Secondary | ICD-10-CM | POA: Diagnosis not present

## 2019-08-11 DIAGNOSIS — D125 Benign neoplasm of sigmoid colon: Secondary | ICD-10-CM

## 2019-08-11 MED ORDER — SODIUM CHLORIDE 0.9 % IV SOLN: 500.0000 mL | Freq: Once | INTRAVENOUS | Status: DC

## 2019-08-11 NOTE — Progress Notes (Signed)
A and O x3. Report to RN. Tolerated MAC anesthesia well.

## 2019-08-11 NOTE — Progress Notes (Signed)
Pt's states no medical or surgical changes since previsit or office visit.   Sm IV, CW vitals, JB temps.

## 2019-08-11 NOTE — Patient Instructions (Addendum)
Thank you  for allowing [place] to care for you today.  Await pathology results of removed polyp.  Will make recommendation at that time for your next colonoscopy.    Resume previous diet and medications today.  Return to your normal activities tomorrow.  Recommend taking daily fiber supplement.  For example Citrucel, Fibercon, Konsyl, or Metamucil as per instructions on label.  Drink plenty of water with these products.  If you are interested in treatment of hemorrhoids, contact clinic to set up appointment for evaluation and treatment for  in-office band ligation.      YOU HAD AN ENDOSCOPIC PROCEDURE TODAY AT [facility]:   Refer to the procedure report that was given to you for any specific questions about what was found during the examination.  If the procedure report does not answer your questions, please call your gastroenterologist to clarify.  If you requested that your care partner not be given the details of your procedure findings, then the procedure report has been included in a sealed envelope for you to review at your convenience later.  YOU SHOULD EXPECT: Some feelings of bloating in the abdomen. Passage of more gas than usual.  Walking can help get rid of the air that was put into your GI tract during the procedure and reduce the bloating. If you had a lower endoscopy (such as a colonoscopy or flexible sigmoidoscopy) you may notice spotting of blood in your stool or on the toilet paper. If you underwent a bowel prep for your procedure, you may not have a normal bowel movement for a few days.  Please Note:  You might notice some irritation and congestion in your nose or some drainage.  This is from the oxygen used during your procedure.  There is no need for concern and it should clear up in a day or so.  SYMPTOMS TO REPORT IMMEDIATELY:   Following lower endoscopy (colonoscopy or flexible sigmoidoscopy):  Excessive amounts of blood in the stool  Significant  tenderness or worsening of abdominal pains  Swelling of the abdomen that is new, acute  Fever of 100F or higher   For urgent or emergent issues, a gastroenterologist can be reached at any hour by calling [phone].   DIET:  We do recommend a small meal at first, but then you may proceed to your regular diet.  Drink plenty of fluids but you should avoid alcoholic beverages for 24 hours.  ACTIVITY:  You should plan to take it easy for the rest of today and you should NOT DRIVE or use heavy machinery until tomorrow (because of the sedation medicines used during the test).    FOLLOW UP: Our staff will call the number listed on your records 48-72 hours following your procedure to check on you and address any questions or concerns that you may have regarding the information given to you following your procedure. If we do not reach you, we will leave a message.  We will attempt to reach you two times.  During this call, we will ask if you have developed any symptoms of COVID 19. If you develop any symptoms (ie: fever, flu-like symptoms, shortness of breath, cough etc.) before then, please call [phone].  If you test positive for Covid 19 in the 2 weeks post procedure, please call and report this information to [place].    If any biopsies were taken you will be contacted by phone or by letter within the next 1-3 weeks.  Please call us  at [phone] if you have not heard about the biopsies in 3 weeks.    SIGNATURES/CONFIDENTIALITY: You and/or your care partner have signed paperwork which will be entered into your electronic medical record.  These signatures attest to the fact that that the information above on your After Visit Summary has been reviewed and is understood.  Full responsibility of the confidentiality of this discharge information lies with you and/or your care-partner.

## 2019-08-11 NOTE — Op Note (Signed)
[facility] Patient Name: Samuel Wolfe Procedure Date: 08/11/2019 10:12 AM MRN: [id number] Endoscopist: Gerrit Heck , MD Age: 53 Referring MD:  Date of Birth: 21-Oct-1965 Gender: Male Account #: [id number] Procedure:                Colonoscopy Indications:              Screening for colorectal malignant neoplasm, This                            is the patient's first colonoscopy Medicines:                Monitored Anesthesia Care Procedure:                Pre-Anesthesia Assessment:                           - Prior to the procedure, a History and Physical                            was performed, and patient medications and                            allergies were reviewed. The patient's tolerance of                            previous anesthesia was also reviewed. The risks                            and benefits of the procedure and the sedation                            options and risks were discussed with the patient.                            All questions were answered, and informed consent                            was obtained. Prior Anticoagulants: The patient has                            taken no previous anticoagulant or antiplatelet                            agents. ASA Grade Assessment: II - A patient with                            mild systemic disease. After reviewing the risks                            and benefits, the patient was deemed in                            satisfactory condition to undergo the procedure.  After obtaining informed consent, the colonoscope                            was passed under direct vision. Throughout the                            procedure, the patient's blood pressure, pulse, and                            oxygen saturations were monitored continuously. The                            Colonoscope was introduced through the anus and                            advanced to the the cecum,  identified by the                            ileocecal valve. The colonoscopy was performed                            without difficulty. The patient tolerated the                            procedure well. The quality of the bowel                            preparation was excellent. Scope In: 10:31:03 AM Scope Out: 10:53:18 AM Scope Withdrawal Time: 0 hours 18 minutes 17 seconds  Total Procedure Duration: 0 hours 22 minutes 15 seconds  Findings:                 The perianal and digital rectal examinations were                            normal.                           A 12 mm polyp was found in the sigmoid colon. The                            polyp was removed with a cold snare. Resection and                            retrieval were complete. Estimated blood loss was                            minimal.                           Non-bleeding internal hemorrhoids were found during                            anoscopy. The hemorrhoids were medium-sized.  Retroflexion in the rectum was not performed due to                            narrowed rectal vault.                           The exam was otherwise normal throughout the                            remainder of the colon. Complications:            No immediate complications. Estimated Blood Loss:     Estimated blood loss was minimal. Impression:               - One 12 mm polyp in the sigmoid colon, removed                            with a cold snare. Resected and retrieved.                           - Non-bleeding internal hemorrhoids. Recommendation:           - Patient has a contact number available for                            emergencies. The signs and symptoms of potential                            delayed complications were discussed with the                            patient. Return to normal activities tomorrow.                            Written discharge instructions were provided to the                             patient.                           - Resume previous diet.                           - Continue present medications.                           - Await pathology results.                           - Repeat colonoscopy for surveillance based on                            pathology results.                           - Return to GI clinic PRN.                           -  Use fiber, for example Citrucel, Fibercon, Konsyl                            or Metamucil.                           - Internal hemorrhoids were noted on this study and                            may be amenable to hemorrhoid band ligation. If you                            are interested in further treatment of these                            hemorrhoids with band ligation, please contact my                            clinic to set up an appointment for evaluation and                            treatment. Gerrit Heck, MD 08/11/2019 11:04:12 AM

## 2019-08-11 NOTE — Progress Notes (Signed)
Called to room to assist during endoscopic procedure.  Patient ID and intended procedure confirmed with present staff. Received instructions for my participation in the procedure from the performing physician.  

## 2019-08-15 ENCOUNTER — Telehealth: Payer: Self-pay | Admitting: *Deleted

## 2019-08-15 NOTE — Telephone Encounter (Signed)
No answer for second follow up call. Left message for patient to call with questions or concerns. SM

## 2019-08-15 NOTE — Telephone Encounter (Signed)
No answer, no voicemail available

## 2019-08-21 ENCOUNTER — Encounter: Payer: Self-pay | Admitting: Gastroenterology

## 2019-08-31 ENCOUNTER — Telehealth: Payer: Self-pay | Admitting: Family Medicine

## 2019-08-31 NOTE — Telephone Encounter (Signed)
Copied from [place] [phone]. Topic: General - Inquiry >> Aug 30, 2019  2:59 PM Richardo Priest, NT wrote: Reason for CRM: Pt called in stating he would like to speak with PCP in regards to get an MRI done instead of a biopsy from the recommendation from urologist. Please advise as pt would like feedback if this can be done.

## 2019-08-31 NOTE — Telephone Encounter (Signed)
Please schedule patient a tele Med visit to go over question and concerns that would like to talk to the Dr about to see if there is a plan that both could agree on

## 2019-08-31 NOTE — Telephone Encounter (Signed)
Copied from [place] [phone]. Topic: General - Inquiry >> Aug 30, 2019  2:59 PM Richardo Priest, NT wrote: Reason for CRM: Pt called in stating he would like to speak with PCP in regards to get an MRI done instead of a biopsy from the recommendation from urologist. Please advise as pt would like feedback if this can be done.

## 2019-09-01 ENCOUNTER — Other Ambulatory Visit: Payer: Self-pay

## 2019-09-01 ENCOUNTER — Ambulatory Visit (INDEPENDENT_AMBULATORY_CARE_PROVIDER_SITE_OTHER): Payer: Commercial Managed Care - PPO | Admitting: Family Medicine

## 2019-09-01 ENCOUNTER — Encounter: Payer: Self-pay | Admitting: Family Medicine

## 2019-09-01 VITALS — BP 128/70 | HR 75 | Temp 98.0°F | Ht 68.0 in | Wt 158.8 lb

## 2019-09-01 DIAGNOSIS — N401 Enlarged prostate with lower urinary tract symptoms: Secondary | ICD-10-CM | POA: Diagnosis not present

## 2019-09-01 DIAGNOSIS — R3912 Poor urinary stream: Secondary | ICD-10-CM

## 2019-09-01 DIAGNOSIS — R030 Elevated blood-pressure reading, without diagnosis of hypertension: Secondary | ICD-10-CM

## 2019-09-01 DIAGNOSIS — R972 Elevated prostate specific antigen [PSA]: Secondary | ICD-10-CM

## 2019-09-01 NOTE — Patient Instructions (Signed)
° ° ° °  If you have lab work done today you will be contacted with your lab results within the next 2 weeks.  If you have not heard from us then please contact us. The fastest way to get your results is to register for My Chart. ° ° °IF you received an x-ray today, you will receive an invoice from [facility]. Please contact  Radiology at [phone] with questions or concerns regarding your invoice.  ° °IF you received labwork today, you will receive an invoice from [facility]. Please contact [facility] at [phone] with questions or concerns regarding your invoice.  ° °Our billing staff will not be able to assist you with questions regarding bills from these companies. ° °You will be contacted with the lab results as soon as they are available. The fastest way to get your results is to activate your My Chart account. Instructions are located on the last page of this paperwork. If you have not heard from us regarding the results in 2 weeks, please contact this office. °  ° ° ° °

## 2019-09-01 NOTE — Progress Notes (Signed)
12/11/20208:43 AM  Samuel Wolfe 04/04/1966, 53 y.o., male [id number]  Chief Complaint  Patient presents with  . Follow-up    HPI:   Patient is a 53 y.o. male  who presents today for followup on starting flomax for BPH   Last OV Oct 2020 Started on flomax - helping urinary sx PSA 6.5, referred to urology - saw them, repeated PSA was 20, recommended biopsy, scheduled for Jan 2021 Had non cancerous polyps on colonoscopy, repeat in 10 year  Depression screen [facility] 2/9 09/01/2019 07/04/2019 07/11/2018  Decreased Interest 0 0 0  Down, Depressed, Hopeless 0 0 0  PHQ - 2 Score 0 0 0    Fall Risk  09/01/2019 07/04/2019 07/11/2018 07/11/2018  Falls in the past year? 0 0 No No  Number falls in past yr: 0 0 - -  Injury with Fall? 0 0 - -     No Known Allergies  Prior to Admission medications   Medication Sig Start Date End Date Taking? Authorizing Provider  tamsulosin (FLOMAX) 0.4 MG CAPS capsule Take 0.4 mg by mouth daily.   Yes [provider]  levofloxacin (LEVAQUIN) 750 MG tablet Take 750 mg by mouth daily. 08/28/19   [provider]    History reviewed. No pertinent past medical history.  Past Surgical History:  Procedure Laterality Date  . SIGMOIDOSCOPY  15 years ago    hems only  . VASECTOMY  2002    Social History   Tobacco Use  . Smoking status: Never Smoker  . Smokeless tobacco: Never Used  Substance Use Topics  . Alcohol use: Yes    Alcohol/week: 7.0 - 8.0 standard drinks    Types: 6 - 7 Cans of beer, 1 Glasses of wine per week    Family History  Problem Relation Age of Onset  . Diabetes Father   . Heart disease Father   . Kidney disease Father   . Colon cancer Neg Hx   . Colon polyps Neg Hx   . Esophageal cancer Neg Hx   . Rectal cancer Neg Hx   . Stomach cancer Neg Hx     ROS Per hpi  OBJECTIVE:  Today's Vitals   09/01/19 0838 09/01/19 0910  BP: (!) 151/84 128/70  Pulse: 75   Temp: 98 F (36.7 C)   SpO2: 98%     Weight: 158 lb 12.8 oz (72 kg)   Height: 5\' 8"  (1.727 m)    Body mass index is 24.15 kg/m.  BP Readings from Last 3 Encounters:  09/01/19 128/70  08/11/19 112/62  07/04/19 139/85    Physical Exam Vitals and nursing note reviewed.  Constitutional:      Appearance: He is well-developed.  HENT:     Head: Normocephalic and atraumatic.  Eyes:     Conjunctiva/sclera: Conjunctivae normal.     Pupils: Pupils are equal, round, and reactive to light.  Pulmonary:     Effort: Pulmonary effort is normal.  Musculoskeletal:     Cervical back: Neck supple.  Skin:    General: Skin is warm and dry.  Neurological:     Mental Status: He is alert and oriented to person, place, and time.       No results found for this or any previous visit (from the past 24 hour(s)).  No results found.   ASSESSMENT and PLAN  1. Elevated PSA Managed by urology, pending bx  2. Benign prostatic hyperplasia with weak urinary stream Controlled. Continue current regime.  3. Elevated BP without diagnosis of hypertension - repeat BP - normal, white coat hypertension  Other orders - levofloxacin (LEVAQUIN) 750 MG tablet; Take 750 mg by mouth daily.  No follow-ups on file.    Rutherford Guys, MD [facility] at [facility] [place], [place] [address] Ph.  [phone] Fax [phone]

## 2019-09-06 ENCOUNTER — Other Ambulatory Visit: Payer: Self-pay | Admitting: Urology

## 2019-09-06 DIAGNOSIS — R972 Elevated prostate specific antigen [PSA]: Secondary | ICD-10-CM

## 2019-09-11 ENCOUNTER — Other Ambulatory Visit: Payer: Self-pay | Admitting: Urology

## 2019-09-29 ENCOUNTER — Ambulatory Visit
Admission: RE | Admit: 2019-09-29 | Discharge: 2019-09-29 | Disposition: A | Discharge status: Home / Self Care | Payer: Commercial Managed Care - PPO | Source: Ambulatory Visit | Attending: Urology | Admitting: Urology

## 2019-09-29 ENCOUNTER — Other Ambulatory Visit: Payer: Self-pay

## 2019-09-29 ENCOUNTER — Other Ambulatory Visit: Payer: Commercial Managed Care - PPO

## 2019-09-29 DIAGNOSIS — R972 Elevated prostate specific antigen [PSA]: Secondary | ICD-10-CM

## 2019-09-29 MED ORDER — GADOBENATE DIMEGLUMINE 529 MG/ML IV SOLN
14.0000 mL | Freq: Once | INTRAVENOUS | Status: AC | PRN
  Administered 2019-09-29: 14 mL via INTRAVENOUS

## 2019-12-10 ENCOUNTER — Encounter: Payer: Self-pay | Admitting: Family Medicine

## 2019-12-31 ENCOUNTER — Encounter: Payer: Self-pay | Admitting: Family Medicine

## 2020-06-21 ENCOUNTER — Ambulatory Visit (INDEPENDENT_AMBULATORY_CARE_PROVIDER_SITE_OTHER): Payer: Commercial Managed Care - PPO | Admitting: Family Medicine

## 2020-06-21 ENCOUNTER — Encounter: Payer: Self-pay | Admitting: Family Medicine

## 2020-06-21 ENCOUNTER — Other Ambulatory Visit: Payer: Self-pay

## 2020-06-21 VITALS — BP 118/75 | HR 79 | Temp 98.2°F | Ht 68.0 in | Wt 153.6 lb

## 2020-06-21 DIAGNOSIS — Z23 Encounter for immunization: Secondary | ICD-10-CM | POA: Diagnosis not present

## 2020-06-21 DIAGNOSIS — R030 Elevated blood-pressure reading, without diagnosis of hypertension: Secondary | ICD-10-CM

## 2020-06-21 NOTE — Progress Notes (Signed)
Patient ID: Samuel Wolfe, male    DOB: [birth year]  Age: 54 y.o. MRN: [id number]  Chief Complaint  Patient presents with  . Follow-up    Pt stated that he went to his neuro and had a high BP of 150/80 and he wanted to ben seen bc he was worried     Subjective:  Patient went to another doctor and had a high blood pressure.  He wanted to be seen over here to discuss this.  Usually his blood pressure runs fine.  He was concerned about the elevated reading.  He has generally gotten a lot of regular exercise.  [place] [employer].  He has been under stress with his father dying 2 weeks ago out in [place].  That has required a number of trips back and forth [place].  He is happily married to a nurse who can check his blood pressures.  Review of systems was really unremarkable.  No headaches or dizziness.  No chest pains or palpitations.  No neurologic symptoms.  Weight stays good.  Current allergies, medications, problem list, past/family and social histories reviewed.  Objective:  BP 118/75 (BP Location: Right Arm, Patient Position: Sitting, Cuff Size: Normal)   Pulse 79   Temp 98.2 F (36.8 C) (Temporal)   Ht 5\' 8"  (1.727 m)   Wt 153 lb 9.6 oz (69.7 kg)   SpO2 99%   BMI 23.35 kg/m   Lean gentleman in no acute distress.  Did not examine him today.  We just discussed his needs.  About 20 minutes.  Assessment & Plan:   Assessment: 1. Elevated blood pressure, situational   2. Need for prophylactic vaccination and inoculation against influenza       Plan: See instructions.  Orders Placed This Encounter  Procedures  . Flu Vaccine QUAD 36+ mos IM    No orders of the defined types were placed in this encounter.        Patient Instructions    Your blood pressure is beautiful today at 118/75.  Generally it runs pretty good.  I do not think you need to be terribly concerned about it, but recommend that you get a blood pressure cuff and monitor your readings a  couple of times a week and keep a listing of them.  If you find that you are frequently getting readings greater than 140/90, then you should come back in and discuss them.  Return as needed.  A little less than a year ago all your blood chemistries look good.  I do not believe you need any labs done today.   If you have lab work done today you will be contacted with your lab results within the next 2 weeks.  If you have not heard from [place] then please contact us. The fastest way to get your results is to register for My Chart.   IF you received an x-ray today, you will receive an invoice from [facility]. Please contact [facility] at [phone] with questions or concerns regarding your invoice.   IF you received labwork today, you will receive an invoice from [facility]. Please contact [facility] at [phone] with questions or concerns regarding your invoice.   Our billing staff will not be able to assist you with questions regarding bills from these companies.  You will be contacted with the lab results as soon as they are available. The fastest way to get your results is to activate your My Chart account. Instructions  are located on the last page of this paperwork. If you have not heard from [place] regarding the results in 2 weeks, please contact this office.        Return if symptoms worsen or fail to improve.   Ruben Reason, MD 06/21/2020

## 2020-06-21 NOTE — Patient Instructions (Addendum)
  Your blood pressure is beautiful today at 118/75.  Generally it runs pretty good.  I do not think you need to be terribly concerned about it, but recommend that you get a blood pressure cuff and monitor your readings a couple of times a week and keep a listing of them.  If you find that you are frequently getting readings greater than 140/90, then you should come back in and discuss them.  Return as needed.  A little less than a year ago all your blood chemistries look good.  I do not believe you need any labs done today.   If you have lab work done today you will be contacted with your lab results within the next 2 weeks.  If you have not heard from [place] then please contact us. The fastest way to get your results is to register for My Chart.   IF you received an x-ray today, you will receive an invoice from [facility]. Please contact [facility]iology at [phone] with questions or concerns regarding your invoice.   IF you received labwork today, you will receive an invoice from [facility]. Please contact [facility] at [phone] with questions or concerns regarding your invoice.   Our billing staff will not be able to assist you with questions regarding bills from these companies.  You will be contacted with the lab results as soon as they are available. The fastest way to get your results is to activate your My Chart account. Instructions are located on the last page of this paperwork. If you have not heard from [place] regarding the results in 2 weeks, please contact this office.

## 2020-08-08 ENCOUNTER — Encounter: Payer: Commercial Managed Care - PPO | Admitting: Family Medicine

## 2020-10-29 ENCOUNTER — Ambulatory Visit: Payer: Commercial Managed Care - PPO | Admitting: Family Medicine

## 2020-10-29 ENCOUNTER — Encounter: Payer: Self-pay | Admitting: Family Medicine

## 2020-10-29 ENCOUNTER — Other Ambulatory Visit: Payer: Self-pay

## 2020-10-29 VITALS — BP 138/88 | HR 71 | Temp 98.0°F | Ht 68.0 in | Wt 158.0 lb

## 2020-10-29 DIAGNOSIS — Z1329 Encounter for screening for other suspected endocrine disorder: Secondary | ICD-10-CM | POA: Diagnosis not present

## 2020-10-29 DIAGNOSIS — Z114 Encounter for screening for human immunodeficiency virus [HIV]: Secondary | ICD-10-CM | POA: Diagnosis not present

## 2020-10-29 DIAGNOSIS — R972 Elevated prostate specific antigen [PSA]: Secondary | ICD-10-CM

## 2020-10-29 DIAGNOSIS — Z1159 Encounter for screening for other viral diseases: Secondary | ICD-10-CM

## 2020-10-29 DIAGNOSIS — J302 Other seasonal allergic rhinitis: Secondary | ICD-10-CM

## 2020-10-29 DIAGNOSIS — Z130 Encounter for screening for diseases of the blood and blood-forming organs and certain disorders involving the immune mechanism: Secondary | ICD-10-CM

## 2020-10-29 DIAGNOSIS — Z1321 Encounter for screening for nutritional disorder: Secondary | ICD-10-CM

## 2020-10-29 DIAGNOSIS — Z13228 Encounter for screening for other metabolic disorders: Secondary | ICD-10-CM

## 2020-10-29 NOTE — Patient Instructions (Addendum)
Health Maintenance, Male Adopting a healthy lifestyle and getting preventive care are important in promoting health and wellness. Ask your health care provider about:  The right schedule for you to have regular tests and exams.  Things you can do on your own to prevent diseases and keep yourself healthy. What should I know about diet, weight, and exercise? Eat a healthy diet  Eat a diet that includes plenty of vegetables, fruits, low-fat dairy products, and lean protein.  Do not eat a lot of foods that are high in solid fats, added sugars, or sodium.   Maintain a healthy weight Body mass index (BMI) is a measurement that can be used to identify possible weight problems. It estimates body fat based on height and weight. Your health care provider can help determine your BMI and help you achieve or maintain a healthy weight. Get regular exercise Get regular exercise. This is one of the most important things you can do for your health. Most adults should:  Exercise for at least 150 minutes each week. The exercise should increase your heart rate and make you sweat (moderate-intensity exercise).  Do strengthening exercises at least twice a week. This is in addition to the moderate-intensity exercise.  Spend less time sitting. Even light physical activity can be beneficial. Watch cholesterol and blood lipids Have your blood tested for lipids and cholesterol at 55 years of age, then have this test every 5 years. You may need to have your cholesterol levels checked more often if:  Your lipid or cholesterol levels are high.  You are older than 55 years of age.  You are at high risk for heart disease. What should I know about cancer screening? Many types of cancers can be detected early and may often be prevented. Depending on your health history and family history, you may need to have cancer screening at various ages. This may include screening for:  Colorectal cancer.  Prostate  cancer.  Skin cancer.  Lung cancer. What should I know about heart disease, diabetes, and high blood pressure? Blood pressure and heart disease  High blood pressure causes heart disease and increases the risk of stroke. This is more likely to develop in people who have high blood pressure readings, are of African descent, or are overweight.  Talk with your health care provider about your target blood pressure readings.  Have your blood pressure checked: ? Every 3-5 years if you are 82-49 years of age. ? Every year if you are 43 years old or older.  If you are between the ages of 41 and 79 and are a current or former smoker, ask your health care provider if you should have a one-time screening for abdominal aortic aneurysm (AAA). Diabetes Have regular diabetes screenings. This checks your fasting blood sugar level. Have the screening done:  Once every three years after age 69 if you are at a normal weight and have a low risk for diabetes.  More often and at a younger age if you are overweight or have a high risk for diabetes. What should I know about preventing infection? Hepatitis B If you have a higher risk for hepatitis B, you should be screened for this virus. Talk with your health care provider to find out if you are at risk for hepatitis B infection. Hepatitis C Blood testing is recommended for:  Everyone born from [birth year] through 1965.  Anyone with known risk factors for hepatitis C. Sexually transmitted infections (STIs)  You should be  screened each year for STIs, including gonorrhea and chlamydia, if: ? You are sexually active and are younger than 55 years of age. ? You are older than 55 years of age and your health care provider tells you that you are at risk for this type of infection. ? Your sexual activity has changed since you were last screened, and you are at increased risk for chlamydia or gonorrhea. Ask your health care provider if you are at risk.  Ask your  health care provider about whether you are at high risk for HIV. Your health care provider may recommend a prescription medicine to help prevent HIV infection. If you choose to take medicine to prevent HIV, you should first get tested for HIV. You should then be tested every 3 months for as long as you are taking the medicine. Follow these instructions at home: Lifestyle  Do not use any products that contain nicotine or tobacco, such as cigarettes, e-cigarettes, and chewing tobacco. If you need help quitting, ask your health care provider.  Do not use street drugs.  Do not share needles.  Ask your health care provider for help if you need support or information about quitting drugs. Alcohol use  Do not drink alcohol if your health care provider tells you not to drink.  If you drink alcohol: ? Limit how much you have to 0-2 drinks a day. ? Be aware of how much alcohol is in your drink. In the [place] one drink equals one 12 oz bottle of beer (355 mL), one 5 oz glass of wine (148 mL), or one 1 oz glass of hard liquor (44 mL). General instructions  Schedule regular health, dental, and eye exams.  Stay current with your vaccines.  Tell your health care provider if: ? You often feel depressed. ? You have ever been abused or do not feel safe at home. Summary  Adopting a healthy lifestyle and getting preventive care are important in promoting health and wellness.  Follow your health care provider's instructions about healthy diet, exercising, and getting tested or screened for diseases.  Follow your health care provider's instructions on monitoring your cholesterol and blood pressure. This information is not intended to replace advice given to you by your health care provider. Make sure you discuss any questions you have with your health care provider. Document Revised: 08/31/2018 Document Reviewed: 08/31/2018 Elsevier Patient Education  2021 [employer].  If you have lab work done  today you will be contacted with your lab results within the next 2 weeks.  If you have not heard from [place] then please contact us. The fastest way to get your results is to register for My Chart.   IF you received an x-ray today, you will receive an invoice from [facility]. Please contact [facility] Radiology at [phone] with questions or concerns regarding your invoice.   IF you received labwork today, you will receive an invoice from [place]. Please contact [facility] at [phone] with questions or concerns regarding your invoice.   Our billing staff will not be able to assist you with questions regarding bills from these companies.  You will be contacted with the lab results as soon as they are available. The fastest way to get your results is to activate your My Chart account. Instructions are located on the last page of this paperwork. If you have not heard from [place] regarding the results in 2 weeks, please contact this office.

## 2020-10-29 NOTE — Progress Notes (Signed)
 2/8/20223:46 PM  Samuel Wolfe 01/23/1966, 55 y.o., male [id number]  Chief Complaint  Patient presents with  . Transitions Of Care  . Numbness    In left arm while bicycling usually 1.5 hr 2 to 3 times weekly    HPI:   Patient is a 55 y.o. male with past medical history significant for elevated BP, and elevated PSA who presents today for toc.   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 0 0 0 0 No  Number falls in past yr: 0 0 0 0 -  Injury with Fall? 0 0 0 0 -  Follow up Falls evaluation completed Falls evaluation completed - - -     No Known Allergies  Prior to Admission medications   Medication Sig Start Date End Date Taking? Authorizing Provider  tamsulosin (FLOMAX) 0.4 MG CAPS capsule Take 0.4 mg by mouth daily. Patient not taking: Reported on 10/29/2020    [provider]    History reviewed. No pertinent past medical history.  Past Surgical History:  Procedure Laterality Date  . SIGMOIDOSCOPY  15 years ago    hems only  . VASECTOMY  2002    Social History   Tobacco Use  . Smoking status: Never Smoker  .  Smokeless tobacco: Never Used  Substance Use Topics  . Alcohol use: Yes    Alcohol/week: 7.0 - 8.0 standard drinks    Types: 6 - 7 Cans of beer, 1 Glasses of wine per week    Family History  Problem Relation Age of Onset  . Diabetes Father   . Heart disease Father   . Kidney disease Father   . Colon cancer Neg Hx   . Colon polyps Neg Hx   . Esophageal cancer Neg Hx   . Rectal cancer Neg Hx   . Stomach cancer Neg Hx     Review of Systems  Constitutional: Negative for chills, fever and malaise/fatigue.  HENT: Negative for hearing loss and tinnitus.   Eyes: Negative for blurred vision and double vision.  Respiratory: Negative for cough, shortness of breath and wheezing.   Cardiovascular: Negative for chest pain, palpitations and leg swelling.  Gastrointestinal: Negative for abdominal pain, blood in stool, constipation, diarrhea, heartburn, nausea and vomiting.  Genitourinary: Positive for frequency and urgency. Negative for dysuria and hematuria.  Musculoskeletal: Negative for back pain and joint pain.  Skin: Negative for rash.  Neurological: Positive for tingling (left arm). Negative for dizziness, weakness and headaches.     OBJECTIVE:  Today's Vitals   10/29/20 1502  BP: 138/88  Pulse: 71    Temp: 98 F (36.7 C)  SpO2: 100%  Weight: 158 lb (71.7 kg)  Height: 5' 8" (1.727 m)   Body mass index is 24.02 kg/m.   Physical Exam Vitals reviewed.  Constitutional:      Appearance: Normal appearance.  HENT:     Head: Normocephalic and atraumatic.  Eyes:     Conjunctiva/sclera: Conjunctivae normal.     Pupils: Pupils are equal, round, and reactive to light.  Cardiovascular:     Rate and Rhythm: Normal rate and regular rhythm.     Pulses: Normal pulses.     Heart sounds: Normal heart sounds. No murmur heard. No friction rub. No gallop.   Pulmonary:     Effort: Pulmonary effort is normal. No respiratory distress.     Breath sounds: Normal breath sounds. No stridor.  No wheezing or rales.  Abdominal:     General: Bowel sounds are normal.     Palpations: Abdomen is soft.     Tenderness: There is no abdominal tenderness.  Musculoskeletal:     Right shoulder: Normal.     Left shoulder: Normal.     Right upper arm: Normal.     Left upper arm: Normal.     Right elbow: Normal.     Left elbow: Normal.     Cervical back: Normal.     Right lower leg: No edema.     Left lower leg: No edema.  Skin:    General: Skin is warm and dry.  Neurological:     General: No focal deficit present.     Mental Status: He is alert and oriented to person, place, and time.  Psychiatric:        Mood and Affect: Mood normal.        Behavior: Behavior normal.     No results found for this or any previous visit (from the past 24 hour(s)).  No results found.   ASSESSMENT and PLAN  Problem List Items Addressed This Visit   None   Visit Diagnoses    Encounter for hepatitis C screening test for low risk patient    -  Primary   Relevant Orders   Hepatitis C antibody   Screening for HIV (human immunodeficiency virus)       Relevant Orders   HIV Antibody (routine testing w rflx)   Screening for endocrine, metabolic and immunity disorder       Relevant Orders   CMP14+EGFR   CBC   Hemoglobin A1c   Lipid Panel   TSH   Encounter for vitamin deficiency screening       Relevant Orders   Vitamin D, 25-hydroxy   Elevated PSA       Relevant Orders   PSA   Seasonal allergies          Plan . Will follow up with labs tomorrow   Return in about 1 year (around 10/29/2021).    Kelsea Just, FNP-BC [facility] at [facility] [address] [place], [place] [address] Ph.  [phone] Fax [phone]   

## 2020-10-30 LAB — CBC
Hematocrit: 45.8 % (ref 37.5–51.0)
Hemoglobin: 15.8 g/dL (ref 13.0–17.7)
MCH: 31 pg (ref 26.6–33.0)
MCHC: 34.5 g/dL (ref 31.5–35.7)
MCV: 90 fL (ref 79–97)
Platelets: 249 10*3/uL (ref 150–450)
RBC: 5.09 x10E6/uL (ref 4.14–5.80)
RDW: 12.4 % (ref 11.6–15.4)
WBC: 5.6 10*3/uL (ref 3.4–10.8)

## 2020-10-30 LAB — CMP14+EGFR
ALT: 15 IU/L (ref 0–44)
AST: 26 IU/L (ref 0–40)
Albumin/Globulin Ratio: 2 (ref 1.2–2.2)
Albumin: 4.5 g/dL (ref 3.8–4.9)
Alkaline Phosphatase: 70 IU/L (ref 44–121)
BUN/Creatinine Ratio: 8 — ABNORMAL LOW (ref 9–20)
BUN: 7 mg/dL (ref 6–24)
Bilirubin Total: 0.9 mg/dL (ref 0.0–1.2)
CO2: 25 mmol/L (ref 20–29)
Calcium: 9.2 mg/dL (ref 8.7–10.2)
Chloride: 102 mmol/L (ref 96–106)
Creatinine, Ser: 0.87 mg/dL (ref 0.76–1.27)
GFR calc Af Amer: 113 mL/min/{1.73_m2} (ref >59)
GFR calc non Af Amer: 98 mL/min/{1.73_m2} (ref >59)
Globulin, Total: 2.3 g/dL (ref 1.5–4.5)
Glucose: 84 mg/dL (ref 65–99)
Potassium: 4.3 mmol/L (ref 3.5–5.2)
Sodium: 141 mmol/L (ref 134–144)
Total Protein: 6.8 g/dL (ref 6.0–8.5)

## 2020-10-30 LAB — LIPID PANEL
Chol/HDL Ratio: 3.8 ratio (ref 0.0–5.0)
Cholesterol, Total: 176 mg/dL (ref 100–199)
HDL: 46 mg/dL (ref >39)
LDL Chol Calc (NIH): 115 mg/dL — ABNORMAL HIGH (ref 0–99)
Triglycerides: 83 mg/dL (ref 0–149)
VLDL Cholesterol Cal: 15 mg/dL (ref 5–40)

## 2020-10-30 LAB — HEMOGLOBIN A1C
Est. average glucose Bld gHb Est-mCnc: 103 mg/dL
Hgb A1c MFr Bld: 5.2 % (ref 4.8–5.6)

## 2020-10-30 LAB — PSA: Prostate Specific Ag, Serum: 6.4 ng/mL — ABNORMAL HIGH (ref 0.0–4.0)

## 2020-10-30 LAB — HIV ANTIBODY (ROUTINE TESTING W REFLEX): HIV Screen 4th Generation wRfx: NONREACTIVE

## 2020-10-30 LAB — HEPATITIS C ANTIBODY: Hep C Virus Ab: <0.1 s/co ratio (ref 0.0–0.9)

## 2020-10-30 LAB — VITAMIN D 25 HYDROXY (VIT D DEFICIENCY, FRACTURES): Vit D, 25-Hydroxy: 13.5 ng/mL — ABNORMAL LOW (ref 30.0–100.0)

## 2020-10-30 LAB — TSH: TSH: 1.2 u[IU]/mL (ref 0.450–4.500)

## 2021-10-21 IMAGING — MR MR PROSTATE WO/W CM
56 series · 56 of 56 positions shown · IV contrast (multihance)
Comparison: Pelvic radiographs 06/20/2018

CLINICAL DATA: Elevated PSA, recently on 08/25/2019 the PSA level
was 20.6.

EXAM:
MR PROSTATE WITHOUT AND WITH CONTRAST
TECHNIQUE: Multiplanar multisequence MRI images were obtained of the pelvis
centered about the prostate. Pre and post contrast images were
obtained.
CONTRAST:  14mL MULTIHANCE GADOBENATE DIMEGLUMINE 529 MG/ML IV SOLN

[Series 3: bSSFP fat-sat · axial · 8.0mm · 0.74mm/px · 1 of 28 slices shown]
[im 1/28]
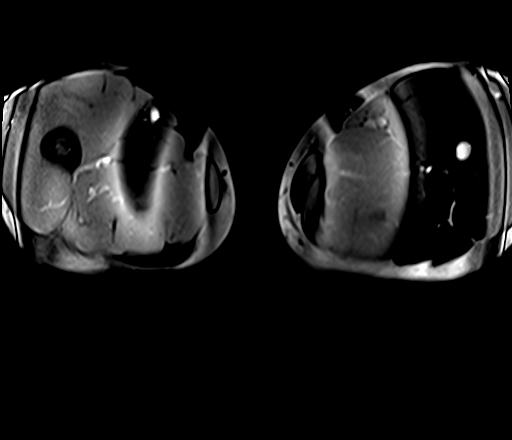

[Series 4: T1 · axial · 5.0mm · 1.25mm/px · 1 of 80 slices shown]
[im 1/80]
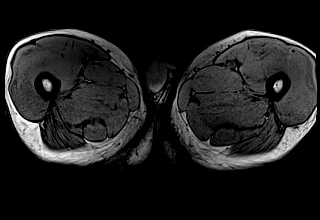

[Series 5: T2 · coronal · 3.5mm · 0.56mm/px · 1 of 23 slices shown (1 of 3)]
[im 1/23]
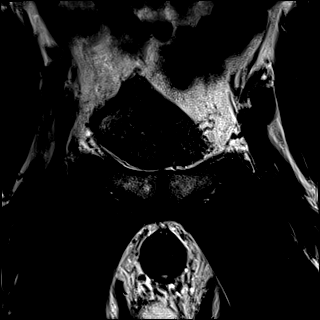

[Series 6: DWI · axial · 3.5mm · 1.56mm/px · 1 of 60 slices shown (1 of 7)]
[im 1/60]
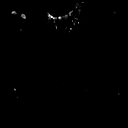

[Series 7: DWI · axial · 3.5mm · 1.56mm/px · 1 of 20 slices shown (2 of 7)]
[im 1/20]
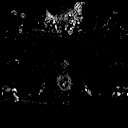

[Series 8: DWI · axial · 3.5mm · 1.75mm/px · 1 of 56 slices shown (3 of 7)]
[im 1/56]
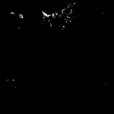

[Series 9: DWI · axial · 3.5mm · 1.75mm/px · 1 of 20 slices shown (4 of 7)]
[im 1/20]
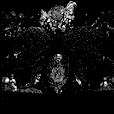

[Series 10: DWI · axial · 3.5mm · 1.56mm/px · 1 of 77 slices shown (5 of 7)]
[im 1/77]
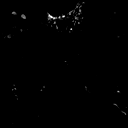

[Series 11: DWI · axial · 3.5mm · 1.56mm/px · 1 of 20 slices shown (6 of 7)]
[im 1/20]
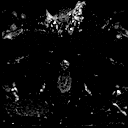

[Series 12: DWI · axial · 3.5mm · 1.56mm/px · 1 of 20 slices shown (7 of 7)]
[im 1/20]
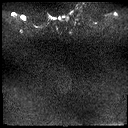

[Series 13: T2 · axial · 3.5mm · 0.56mm/px · 1 of 23 slices shown (2 of 3)]
[im 1/23]
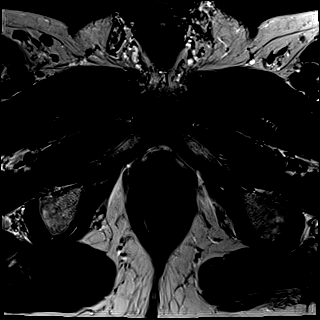

[Series 14: T2 · axial · 1.0mm · 1.04mm/px · 1 of 80 slices shown (3 of 3)]
[im 1/80]
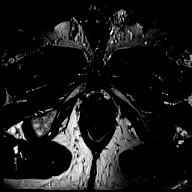

[Series 15: pre t1_twist_tra_dyn_ttc=5.3s · axial · non-contrast · 3.5mm · 0.83mm/px · 1 of 20 slices shown]
[im 1/20]
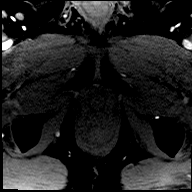

[Series 16: post t1_twist_tra_dyn-copy center · axial · 3.5mm · 0.83mm/px · 1 of 20 slices shown (1 of 22)]
[im 1/20]
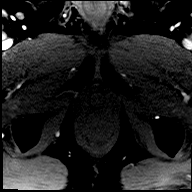

[Series 17: post t1_twist_tra_dyn-copy center · axial · 3.5mm · 0.83mm/px · 1 of 20 slices shown (2 of 22)]
[im 1/20]
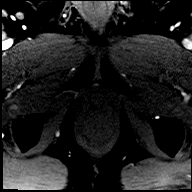

[Series 18: post t1_twist_tra_dyn-copy cent_sub_ttc=(id) · axial · 3.5mm · 0.83mm/px · 1 of 20 slices shown (1 of 21)]
[im 1/20]
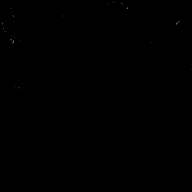

[Series 19: post t1_twist_tra_dyn-copy center · axial · 3.5mm · 0.83mm/px · 1 of 20 slices shown (3 of 22)]
[im 1/20]
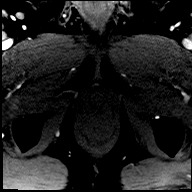

[Series 20: post t1_twist_tra_dyn-copy cent_sub_ttc=(id) · axial · 3.5mm · 0.83mm/px · 1 of 20 slices shown (2 of 21)]
[im 1/20]
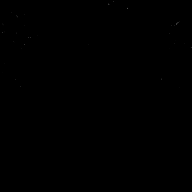

[Series 21: post t1_twist_tra_dyn-copy center · axial · 3.5mm · 0.83mm/px · 1 of 20 slices shown (4 of 22)]
[im 1/20]
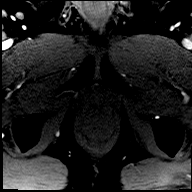

[Series 22: post t1_twist_tra_dyn-copy cent_sub_ttc=(id) · axial · 3.5mm · 0.83mm/px · 1 of 20 slices shown (3 of 21)]
[im 1/20]
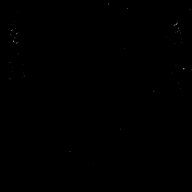

[Series 23: post t1_twist_tra_dyn-copy center · axial · 3.5mm · 0.83mm/px · 1 of 20 slices shown (5 of 22)]
[im 1/20]
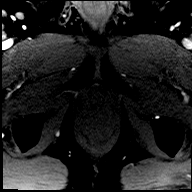

[Series 24: post t1_twist_tra_dyn-copy cent_sub_ttc=(id) · axial · 3.5mm · 0.83mm/px · 1 of 20 slices shown (4 of 21)]
[im 1/20]
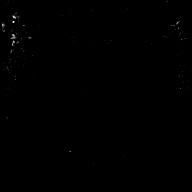

[Series 25: post t1_twist_tra_dyn-copy center · axial · 3.5mm · 0.83mm/px · 1 of 20 slices shown (6 of 22)]
[im 1/20]
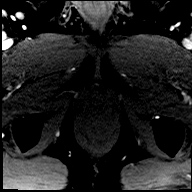

[Series 26: post t1_twist_tra_dyn-copy cent_sub_ttc=(id) · axial · 3.5mm · 0.83mm/px · 1 of 20 slices shown (5 of 21)]
[im 1/20]
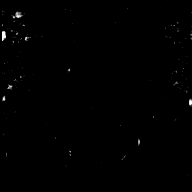

[Series 27: post t1_twist_tra_dyn-copy center · axial · 3.5mm · 0.83mm/px · 1 of 20 slices shown (7 of 22)]
[im 1/20]
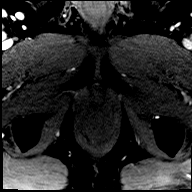

[Series 28: post t1_twist_tra_dyn-copy cent_sub_ttc=(id) · axial · 3.5mm · 0.83mm/px · 1 of 20 slices shown (6 of 21)]
[im 1/20]
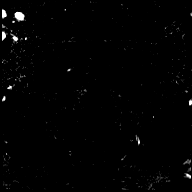

[Series 29: post t1_twist_tra_dyn-copy center · axial · 3.5mm · 0.83mm/px · 1 of 20 slices shown (8 of 22)]
[im 1/20]
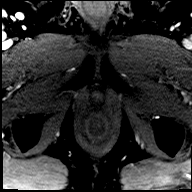

[Series 30: post t1_twist_tra_dyn-copy cent_sub_ttc=(id) · axial · 3.5mm · 0.83mm/px · 1 of 20 slices shown (7 of 21)]
[im 1/20]
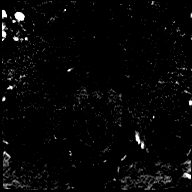

[Series 31: post t1_twist_tra_dyn-copy center · axial · 3.5mm · 0.83mm/px · 1 of 20 slices shown (9 of 22)]
[im 1/20]
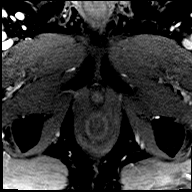

[Series 32: post t1_twist_tra_dyn-copy cent_sub_ttc=(id) · axial · 3.5mm · 0.83mm/px · 1 of 20 slices shown (8 of 21)]
[im 1/20]
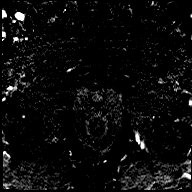

[Series 33: post t1_twist_tra_dyn-copy center · axial · 3.5mm · 0.83mm/px · 1 of 20 slices shown (10 of 22)]
[im 1/20]
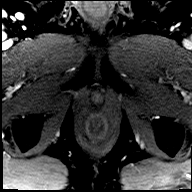

[Series 34: post t1_twist_tra_dyn-copy cent_sub_ttc=(id) · axial · 3.5mm · 0.83mm/px · 1 of 20 slices shown (9 of 21)]
[im 1/20]
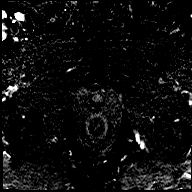

[Series 35: post t1_twist_tra_dyn-copy center · axial · 3.5mm · 0.83mm/px · 1 of 20 slices shown (11 of 22)]
[im 1/20]
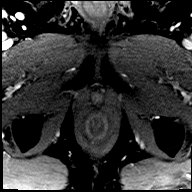

[Series 36: post t1_twist_tra_dyn-copy cent_sub_ttc=(id) · axial · 3.5mm · 0.83mm/px · 1 of 20 slices shown (10 of 21)]
[im 1/20]
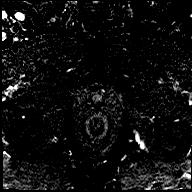

[Series 37: post t1_twist_tra_dyn-copy center · axial · 3.5mm · 0.83mm/px · 1 of 20 slices shown (12 of 22)]
[im 1/20]
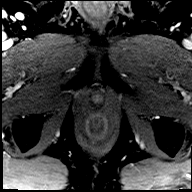

[Series 38: post t1_twist_tra_dyn-copy cent_sub_ttc=(id) · axial · 3.5mm · 0.83mm/px · 1 of 20 slices shown (11 of 21)]
[im 1/20]
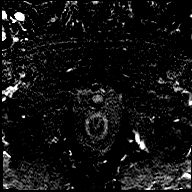

[Series 39: post t1_twist_tra_dyn-copy center · axial · 3.5mm · 0.83mm/px · 1 of 20 slices shown (13 of 22)]
[im 1/20]
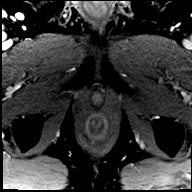

[Series 40: post t1_twist_tra_dyn-copy cent_sub_ttc=(id) · axial · 3.5mm · 0.83mm/px · 1 of 20 slices shown (12 of 21)]
[im 1/20]
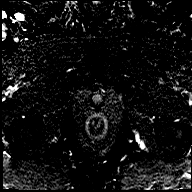

[Series 41: post t1_twist_tra_dyn-copy center · axial · 3.5mm · 0.83mm/px · 1 of 20 slices shown (14 of 22)]
[im 1/20]
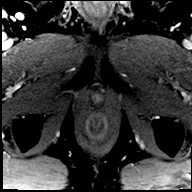

[Series 42: post t1_twist_tra_dyn-copy cent_sub_ttc=(id) · axial · 3.5mm · 0.83mm/px · 1 of 20 slices shown (13 of 21)]
[im 1/20]
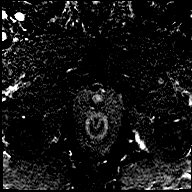

[Series 43: post t1_twist_tra_dyn-copy center · axial · 3.5mm · 0.83mm/px · 1 of 20 slices shown (15 of 22)]
[im 1/20]
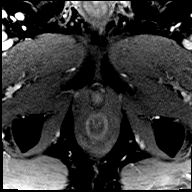

[Series 44: post t1_twist_tra_dyn-copy cent_sub_ttc=(id) · axial · 3.5mm · 0.83mm/px · 1 of 20 slices shown (14 of 21)]
[im 1/20]
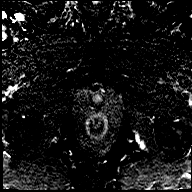

[Series 45: post t1_twist_tra_dyn-copy center · axial · 3.5mm · 0.83mm/px · 1 of 20 slices shown (16 of 22)]
[im 1/20]
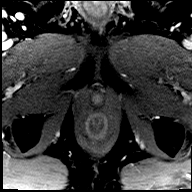

[Series 46: post t1_twist_tra_dyn-copy cent_sub_ttc=(id) · axial · 3.5mm · 0.83mm/px · 1 of 20 slices shown (15 of 21)]
[im 1/20]
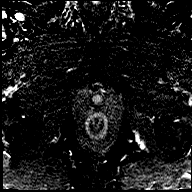

[Series 47: post t1_twist_tra_dyn-copy center · axial · 3.5mm · 0.83mm/px · 1 of 20 slices shown (17 of 22)]
[im 1/20]
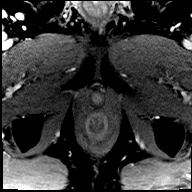

[Series 48: post t1_twist_tra_dyn-copy cent_sub_ttc=(id) · axial · 3.5mm · 0.83mm/px · 1 of 20 slices shown (16 of 21)]
[im 1/20]
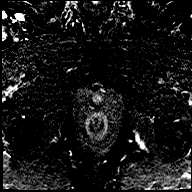

[Series 49: post t1_twist_tra_dyn-copy center · axial · 3.5mm · 0.83mm/px · 1 of 20 slices shown (18 of 22)]
[im 1/20]
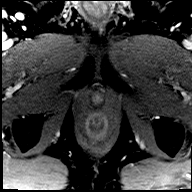

[Series 50: post t1_twist_tra_dyn-copy cent_sub_ttc=(id) · axial · 3.5mm · 0.83mm/px · 1 of 20 slices shown (17 of 21)]
[im 1/20]
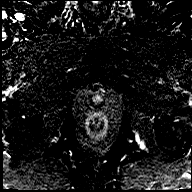

[Series 51: post t1_twist_tra_dyn-copy center · axial · 3.5mm · 0.83mm/px · 1 of 20 slices shown (19 of 22)]
[im 1/20]
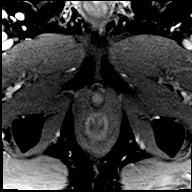

[Series 52: post t1_twist_tra_dyn-copy cent_sub_ttc=(id) · axial · 3.5mm · 0.83mm/px · 1 of 20 slices shown (18 of 21)]
[im 1/20]
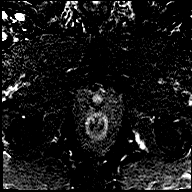

[Series 53: post t1_twist_tra_dyn-copy center · axial · 3.5mm · 0.83mm/px · 1 of 20 slices shown (20 of 22)]
[im 1/20]
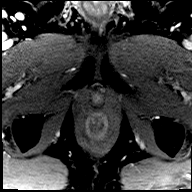

[Series 54: post t1_twist_tra_dyn-copy cent_sub_ttc=(id) · axial · 3.5mm · 0.83mm/px · 1 of 20 slices shown (19 of 21)]
[im 1/20]
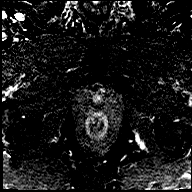

[Series 55: post t1_twist_tra_dyn-copy center · axial · 3.5mm · 0.83mm/px · 1 of 20 slices shown (21 of 22)]
[im 1/20]
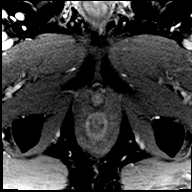

[Series 56: post t1_twist_tra_dyn-copy cent_sub_ttc=(id) · axial · 3.5mm · 0.83mm/px · 1 of 20 slices shown (20 of 21)]
[im 1/20]
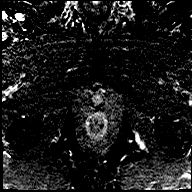

[Series 57: post t1_twist_tra_dyn-copy center · axial · 3.5mm · 0.83mm/px · 1 of 20 slices shown (22 of 22)]
[im 1/20]
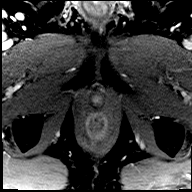

[Series 58: post t1_twist_tra_dyn-copy cent_sub_ttc=(id) · axial · 3.5mm · 0.83mm/px · 1 of 20 slices shown (21 of 21)]
[im 1/20]
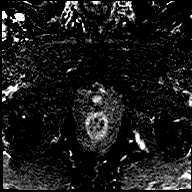

[56 of 56 positions shown; findings below may reference images not displayed]

FINDINGS: Prostate:

Region of interest # 1: PI-RADS category 4 lesion the left
posterolateral peripheral zone in the mid gland faint reduced T2
signal early enhancement. This measures 1.3 by 1.0 by 0.9 cm (0.49
cubic cm).

Region of interest # 2: PI-RADS category 3 lesion the right
posterolateral peripheral zone in the mid gland apex, with reduced
T2 signal but no early enhancement. This lesion measures 1.4 by
by 0.8 cm (0.44 cubic cm).

A 0.7 cm midline cystic lesion along the posterosuperior prostate
gland, potentially a prostate utricle cyst or mullerian duct cyst.
No complicating features.

Volume: 3D volumetric analysis: Prostate volume 60.12 cubic cm (5.5
by 5.0 by 4.4 cm). Down absent

Transcapsular spread:  Absent

Seminal vesicle involvement: Absent

Neurovascular bundle involvement: Absent

Pelvic adenopathy: Absent

Bone metastasis: Absent

Other findings: No supplemental non-categorized findings.
IMPRESSION: 1. PI-RADS category 4 lesion in the left posterolateral peripheral
zone in the mid gland apex. PI-RADS category 3 lesion in the right
posterolateral peripheral zone in the mid gland apex. Targeting data
sent to [facility].
2. 0.7 cm midline cystic lesion along the posterosuperior prostate
gland, potentially a prostate utricle cyst or mullerian duct cyst.
No complicating features.

## 2021-10-30 ENCOUNTER — Encounter: Payer: Commercial Managed Care - PPO | Admitting: Family Medicine

## 2022-02-13 ENCOUNTER — Ambulatory Visit: Payer: BLUE CROSS/BLUE SHIELD | Admitting: Family

## 2022-02-13 VITALS — BP 128/75 | HR 73 | Temp 98.7°F | Resp 16 | Ht 68.0 in | Wt 163.2 lb

## 2022-02-13 DIAGNOSIS — Z13220 Encounter for screening for lipoid disorders: Secondary | ICD-10-CM

## 2022-02-13 DIAGNOSIS — R972 Elevated prostate specific antigen [PSA]: Secondary | ICD-10-CM

## 2022-02-13 DIAGNOSIS — Z0000 Encounter for general adult medical examination without abnormal findings: Secondary | ICD-10-CM | POA: Diagnosis not present

## 2022-02-13 LAB — CBC WITH DIFFERENTIAL/PLATELET
Basophils Absolute: 0 10*3/uL (ref 0.0–0.1)
Basophils Relative: 0.9 % (ref 0.0–3.0)
Eosinophils Absolute: 0.2 10*3/uL (ref 0.0–0.7)
Eosinophils Relative: 4.5 % (ref 0.0–5.0)
HCT: 44.3 % (ref 39.0–52.0)
Hemoglobin: 15.1 g/dL (ref 13.0–17.0)
Lymphocytes Relative: 22.1 % (ref 12.0–46.0)
Lymphs Abs: 1.2 10*3/uL (ref 0.7–4.0)
MCHC: 34 g/dL (ref 30.0–36.0)
MCV: 93.4 fl (ref 78.0–100.0)
Monocytes Absolute: 0.4 10*3/uL (ref 0.1–1.0)
Monocytes Relative: 7.8 % (ref 3.0–12.0)
Neutro Abs: 3.5 10*3/uL (ref 1.4–7.7)
Neutrophils Relative %: 64.7 % (ref 43.0–77.0)
Platelets: 250 10*3/uL (ref 150.0–400.0)
RBC: 4.75 Mil/uL (ref 4.22–5.81)
RDW: 13.8 % (ref 11.5–15.5)
WBC: 5.4 10*3/uL (ref 4.0–10.5)

## 2022-02-13 LAB — LIPID PANEL
Cholesterol: 186 mg/dL (ref 0–200)
HDL: 57.1 mg/dL (ref >39.00)
LDL Cholesterol: 117 mg/dL — ABNORMAL HIGH (ref 0–99)
NonHDL: 128.66
Total CHOL/HDL Ratio: 3
Triglycerides: 58 mg/dL (ref 0.0–149.0)
VLDL: 11.6 mg/dL (ref 0.0–40.0)

## 2022-02-13 LAB — COMPREHENSIVE METABOLIC PANEL
ALT: 24 U/L (ref 0–53)
AST: 33 U/L (ref 0–37)
Albumin: 4.3 g/dL (ref 3.5–5.2)
Alkaline Phosphatase: 52 U/L (ref 39–117)
BUN: 14 mg/dL (ref 6–23)
CO2: 29 mEq/L (ref 19–32)
Calcium: 9.5 mg/dL (ref 8.4–10.5)
Chloride: 103 mEq/L (ref 96–112)
Creatinine, Ser: 0.95 mg/dL (ref 0.40–1.50)
GFR: 90 mL/min (ref >60.00)
Glucose, Bld: 80 mg/dL (ref 70–99)
Potassium: 4.3 mEq/L (ref 3.5–5.1)
Sodium: 139 mEq/L (ref 135–145)
Total Bilirubin: 1.2 mg/dL (ref 0.2–1.2)
Total Protein: 6.9 g/dL (ref 6.0–8.3)

## 2022-02-13 MED ORDER — MONTELUKAST SODIUM 10 MG PO TABS: 10.0000 mg | ORAL_TABLET | Freq: Every day | ORAL | 3 refills | Status: DC

## 2022-02-13 MED ORDER — AZELASTINE HCL 0.1 % NA SOLN: 2.0000 | Freq: Two times a day (BID) | NASAL | 12 refills | Status: DC

## 2022-02-13 NOTE — Progress Notes (Signed)
Gifford Ballon is a 56 y.o. male with the following history as recorded in EpicCare:  Patient Active Problem List   Diagnosis Date Noted   Hemorrhoids, internal, with pain & bleeding 07/04/2013   External hemorrhoid - R ant with pain 07/04/2013    Current Outpatient Medications  Medication Sig Dispense Refill   azelastine (ASTELIN) 0.1 % nasal spray Place 2 sprays into both nostrils 2 (two) times daily. Use in each nostril as directed 30 mL 12   montelukast (SINGULAIR) 10 MG tablet Take 1 tablet (10 mg total) by mouth at bedtime. 30 tablet 3   No current facility-administered medications for this visit.    Allergies: Patient has no known allergies.  No past medical history on file.  Past Surgical History:  Procedure Laterality Date   SIGMOIDOSCOPY  15 years ago    hems only   VASECTOMY  2002    Family History  Problem Relation Age of Onset   Diabetes Father    Heart disease Father    Kidney disease Father    Colon cancer Neg Hx    Colon polyps Neg Hx    Esophageal cancer Neg Hx    Rectal cancer Neg Hx    Stomach cancer Neg Hx     Social History   Tobacco Use   Smoking status: Never   Smokeless tobacco: Never  Substance Use Topics   Alcohol use: Yes    Alcohol/week: 7.0 - 8.0 standard drinks    Types: 6 - 7 Cans of beer, 1 Glasses of wine per week    Subjective:  Presents today as a new patient; transferring from [place]; history of elevated PSA- according to records, prostate biopsy was done in 2021; under care of Dr. Gloriann Loan at [facility] and seeing urology;  History of chronic allergies;   Review of Systems  Constitutional: Negative.   HENT:  Positive for congestion.   Eyes: Negative.   Respiratory: Negative.    Cardiovascular: Negative.   Gastrointestinal: Negative.   Genitourinary: Negative.   Musculoskeletal: Negative.   Skin: Negative.   Neurological: Negative.   Endo/Heme/Allergies: Negative.   Psychiatric/Behavioral: Negative.          Objective:  Vitals:   02/13/22 0832  BP: 128/75  Pulse: 73  Resp: 16  Temp: 98.7 F (37.1 C)  TempSrc: Oral  SpO2: 100%  Weight: 163 lb 3.2 oz (74 kg)  Height: _0  (1.727 m)    General: Well developed, well nourished, in no acute distress  Skin : Warm and dry.  Head: Normocephalic and atraumatic  Eyes: Sclera and conjunctiva clear; pupils round and reactive to light; extraocular movements intact  Ears: External normal; canals clear; tympanic membranes normal  Oropharynx: Pink, supple. No suspicious lesions  Neck: Supple without thyromegaly, adenopathy  Lungs: Respirations unlabored; clear to auscultation bilaterally without wheeze, rales, rhonchi  CVS exam: normal rate and regular rhythm.  Abdomen: Soft; nontender; nondistended; normoactive bowel sounds; no masses or hepatosplenomegaly  Musculoskeletal: No deformities; no active joint inflammation  Extremities: No edema, cyanosis, clubbing  Vessels: Symmetric bilaterally  Neurologic: Alert and oriented; speech intact; face symmetrical; moves all extremities well; CNII-XII intact without focal deficit  Assessment:  1. PE (physical exam), annual   2. Lipid screening   3. Elevated PSA     Plan:  Age appropriate preventive healthcare needs addressed; encouraged regular eye doctor and dental exams; encouraged regular exercise; will update labs and refills as needed today; follow-up to be determined; Continue with  urology as scheduled;  Trial of Astelin and Singulair for allergies- can call back if he would like referral to allergist;    No follow-ups on file.  Orders Placed This Encounter  Procedures   CBC with Differential/Platelet   Comp Met (CMET)   Lipid panel    Requested Prescriptions   Signed Prescriptions Disp Refills   azelastine (ASTELIN) 0.1 % nasal spray 30 mL 12    Sig: Place 2 sprays into both nostrils 2 (two) times daily. Use in each nostril as directed   montelukast (SINGULAIR) 10 MG tablet  30 tablet 3    Sig: Take 1 tablet (10 mg total) by mouth at bedtime.

## 2022-06-22 ENCOUNTER — Encounter: Payer: Self-pay | Admitting: Family

## 2022-08-12 ENCOUNTER — Other Ambulatory Visit: Payer: Self-pay | Admitting: Adult Health

## 2022-08-12 DIAGNOSIS — R972 Elevated prostate specific antigen [PSA]: Secondary | ICD-10-CM

## 2022-09-05 ENCOUNTER — Ambulatory Visit
Admission: RE | Admit: 2022-09-05 | Discharge: 2022-09-05 | Disposition: A | Discharge status: Home / Self Care | Payer: BLUE CROSS/BLUE SHIELD | Source: Ambulatory Visit | Attending: Adult Health | Admitting: Adult Health

## 2022-09-05 DIAGNOSIS — R972 Elevated prostate specific antigen [PSA]: Secondary | ICD-10-CM

## 2022-09-05 MED ORDER — GADOPICLENOL 0.5 MMOL/ML IV SOLN
7.5000 mL | Freq: Once | INTRAVENOUS | Status: AC | PRN
  Administered 2022-09-05: 7.5 mL via INTRAVENOUS

## 2023-12-14 ENCOUNTER — Encounter: Payer: Self-pay | Admitting: Family

## 2023-12-14 ENCOUNTER — Ambulatory Visit (INDEPENDENT_AMBULATORY_CARE_PROVIDER_SITE_OTHER): Payer: BLUE CROSS/BLUE SHIELD | Admitting: Family

## 2023-12-14 VITALS — BP 136/72 | HR 58 | Ht 68.0 in | Wt 161.0 lb

## 2023-12-14 DIAGNOSIS — Z13220 Encounter for screening for lipoid disorders: Secondary | ICD-10-CM

## 2023-12-14 DIAGNOSIS — Z0000 Encounter for general adult medical examination without abnormal findings: Secondary | ICD-10-CM

## 2023-12-14 LAB — CBC WITH DIFFERENTIAL/PLATELET
Absolute Lymphocytes: 1193 {cells}/uL (ref 850–3900)
Absolute Monocytes: 418 {cells}/uL (ref 200–950)
Basophils Absolute: 31 {cells}/uL (ref 0–200)
Basophils Relative: 0.6 %
Eosinophils Absolute: 128 {cells}/uL (ref 15–500)
Eosinophils Relative: 2.5 %
HCT: 45.8 % (ref 38.5–50.0)
Hemoglobin: 15.5 g/dL (ref 13.2–17.1)
MCH: 31.2 pg (ref 27.0–33.0)
MCHC: 33.8 g/dL (ref 32.0–36.0)
MCV: 92.2 fL (ref 80.0–100.0)
MPV: 9.4 fL (ref 7.5–12.5)
Monocytes Relative: 8.2 %
Neutro Abs: 3330 {cells}/uL (ref 1500–7800)
Neutrophils Relative %: 65.3 %
Platelets: 259 10*3/uL (ref 140–400)
RBC: 4.97 10*6/uL (ref 4.20–5.80)
RDW: 12.7 % (ref 11.0–15.0)
Total Lymphocyte: 23.4 %
WBC: 5.1 10*3/uL (ref 3.8–10.8)

## 2023-12-14 LAB — COMPREHENSIVE METABOLIC PANEL
AG Ratio: 1.6 (calc) (ref 1.0–2.5)
ALT: 21 U/L (ref 9–46)
AST: 28 U/L (ref 10–35)
Albumin: 4.3 g/dL (ref 3.6–5.1)
Alkaline phosphatase (APISO): 64 U/L (ref 35–144)
BUN: 12 mg/dL (ref 7–25)
CO2: 29 mmol/L (ref 20–32)
Calcium: 9.6 mg/dL (ref 8.6–10.3)
Chloride: 102 mmol/L (ref 98–110)
Creat: 0.83 mg/dL (ref 0.70–1.30)
Globulin: 2.7 g/dL (ref 1.9–3.7)
Glucose, Bld: 78 mg/dL (ref 65–99)
Potassium: 4.3 mmol/L (ref 3.5–5.3)
Sodium: 140 mmol/L (ref 135–146)
Total Bilirubin: 0.7 mg/dL (ref 0.2–1.2)
Total Protein: 7 g/dL (ref 6.1–8.1)
eGFR: 102 mL/min/{1.73_m2} (ref >=60)

## 2023-12-14 LAB — LIPID PANEL
Cholesterol: 184 mg/dL (ref <200)
HDL: 55 mg/dL (ref >=40)
LDL Cholesterol (Calc): 115 mg/dL — ABNORMAL HIGH
Non-HDL Cholesterol (Calc): 129 mg/dL (ref <130)
Total CHOL/HDL Ratio: 3.3 (calc) (ref <5.0)
Triglycerides: 57 mg/dL (ref <150)

## 2023-12-14 MED ORDER — AZELASTINE HCL 0.1 % NA SOLN: 2.0000 | Freq: Two times a day (BID) | NASAL | 3 refills | Status: AC

## 2023-12-14 MED ORDER — MONTELUKAST SODIUM 10 MG PO TABS: 10.0000 mg | ORAL_TABLET | Freq: Every day | ORAL | 3 refills | Status: AC

## 2023-12-14 NOTE — Patient Instructions (Signed)
 Please plan to get your Shingles vaccine through [facility];

## 2023-12-14 NOTE — Progress Notes (Signed)
 Samuel Wolfe is a 58 y.o. male with the following history as recorded in EpicCare:  Patient Active Problem List   Diagnosis Date Noted   Hemorrhoids, internal, with pain & bleeding 07/04/2013   External hemorrhoid - R ant with pain 07/04/2013    Current Outpatient Medications  Medication Sig Dispense Refill   azelastine (ASTELIN) 0.1 % nasal spray Place 2 sprays into both nostrils 2 (two) times daily. Use in each nostril as directed 90 mL 3   montelukast (SINGULAIR) 10 MG tablet Take 1 tablet (10 mg total) by mouth at bedtime. 90 tablet 3   No current facility-administered medications for this visit.    Allergies: Patient has no known allergies.  No past medical history on file.  Past Surgical History:  Procedure Laterality Date   SIGMOIDOSCOPY  15 years ago    hems only   VASECTOMY  2002    Family History  Problem Relation Age of Onset   Diabetes Father    Heart disease Father    Kidney disease Father    Colon cancer Neg Hx    Colon polyps Neg Hx    Esophageal cancer Neg Hx    Rectal cancer Neg Hx    Stomach cancer Neg Hx     Social History   Tobacco Use   Smoking status: Never   Smokeless tobacco: Never  Substance Use Topics   Alcohol use: Yes    Alcohol/week: 7.0 - 8.0 standard drinks of alcohol    Types: 6 - 7 Cans of beer, 1 Glasses of wine per week    Subjective:   Presents for yearly CPE; last seen here 2 years; no acute concerns today; Up to date on dental and vision exams; tries to exercise regularly;  Does see Dr. Alvester Morin yearly at urology- PSA has been stable; ( was 5.92 at most recent visit);   Review of Systems  Constitutional: Negative.   HENT: Negative.    Eyes: Negative.   Respiratory: Negative.    Cardiovascular: Negative.   Gastrointestinal: Negative.   Genitourinary: Negative.   Musculoskeletal: Negative.   Skin: Negative.   Neurological: Negative.   Endo/Heme/Allergies: Negative.   Psychiatric/Behavioral: Negative.       Objective:   Vitals:   12/14/23 0844  BP: 136/72  Pulse: (!) 58  SpO2: 100%  Weight: 161 lb (73 kg)  Height: 5\' 8"  (1.727 m)    General: Well developed, well nourished, in no acute distress  Skin : Warm and dry.  Head: Normocephalic and atraumatic  Eyes: Sclera and conjunctiva clear; pupils round and reactive to light; extraocular movements intact  Ears: External normal; canals clear; tympanic membranes normal  Oropharynx: Pink, supple. No suspicious lesions  Neck: Supple without thyromegaly, adenopathy  Lungs: Respirations unlabored; clear to auscultation bilaterally without wheeze, rales, rhonchi  CVS exam: normal rate and regular rhythm.  Abdomen: Soft; nontender; nondistended; normoactive bowel sounds; no masses or hepatosplenomegaly  Musculoskeletal: No deformities; no active joint inflammation  Extremities: No edema, cyanosis, clubbing  Vessels: Symmetric bilaterally  Neurologic: Alert and oriented; speech intact; face symmetrical; moves all extremities well; CNII-XII intact without focal deficit   Assessment:  1. PE (physical exam), annual   2. Lipid screening     Plan:  Age appropriate preventive healthcare needs addressed; encouraged regular eye doctor and dental exams; encouraged regular exercise; will update labs and refills as needed today; follow-up in 1 year, sooner prn.    No follow-ups on file.  Orders Placed This Encounter  Procedures   CBC with Differential/Platelet   Comp Met (CMET)   Lipid panel    Requested Prescriptions   Signed Prescriptions Disp Refills   montelukast (SINGULAIR) 10 MG tablet 90 tablet 3    Sig: Take 1 tablet (10 mg total) by mouth at bedtime.   azelastine (ASTELIN) 0.1 % nasal spray 90 mL 3    Sig: Place 2 sprays into both nostrils 2 (two) times daily. Use in each nostril as directed

## 2023-12-15 ENCOUNTER — Encounter: Payer: Self-pay | Admitting: Family

## 2024-12-19 ENCOUNTER — Encounter: Admitting: Family
